# Patient Record
Sex: Female | Born: 1965 | Race: White | Hispanic: No | Marital: Married | Smoking: Never smoker
Health system: Southern US, Community
[De-identification: ages and names within clinical notes are randomized; demographics above are authoritative.]

## PROBLEM LIST (undated history)

## (undated) DIAGNOSIS — R609 Edema, unspecified: Secondary | ICD-10-CM

## (undated) DIAGNOSIS — Z872 Personal history of diseases of the skin and subcutaneous tissue: Secondary | ICD-10-CM

## (undated) DIAGNOSIS — R069 Unspecified abnormalities of breathing: Secondary | ICD-10-CM

## (undated) DIAGNOSIS — I10 Essential (primary) hypertension: Secondary | ICD-10-CM

## (undated) DIAGNOSIS — Z8709 Personal history of other diseases of the respiratory system: Secondary | ICD-10-CM

## (undated) DIAGNOSIS — L659 Nonscarring hair loss, unspecified: Secondary | ICD-10-CM

## (undated) DIAGNOSIS — R7303 Prediabetes: Secondary | ICD-10-CM

## (undated) DIAGNOSIS — R0789 Other chest pain: Secondary | ICD-10-CM

## (undated) DIAGNOSIS — T7840XA Allergy, unspecified, initial encounter: Secondary | ICD-10-CM

## (undated) DIAGNOSIS — K219 Gastro-esophageal reflux disease without esophagitis: Secondary | ICD-10-CM

## (undated) DIAGNOSIS — G5601 Carpal tunnel syndrome, right upper limb: Secondary | ICD-10-CM

## (undated) DIAGNOSIS — R0600 Dyspnea, unspecified: Secondary | ICD-10-CM

## (undated) DIAGNOSIS — R0982 Postnasal drip: Secondary | ICD-10-CM

## (undated) DIAGNOSIS — Z98811 Dental restoration status: Secondary | ICD-10-CM

## (undated) DIAGNOSIS — E669 Obesity, unspecified: Secondary | ICD-10-CM

## (undated) DIAGNOSIS — J45909 Unspecified asthma, uncomplicated: Secondary | ICD-10-CM

## (undated) DIAGNOSIS — E781 Pure hyperglyceridemia: Secondary | ICD-10-CM

## (undated) HISTORY — DX: Nonscarring hair loss, unspecified: L65.9

## (undated) HISTORY — PX: OVARIAN CYST REMOVAL: SHX89

## (undated) HISTORY — DX: Allergy, unspecified, initial encounter: T78.40XA

## (undated) HISTORY — PX: OTHER SURGICAL HISTORY: SHX169

## (undated) HISTORY — DX: Essential (primary) hypertension: I10

## (undated) HISTORY — DX: Personal history of diseases of the skin and subcutaneous tissue: Z87.2

## (undated) HISTORY — DX: Obesity, unspecified: E66.9

## (undated) HISTORY — PX: WISDOM TOOTH EXTRACTION: SHX21

## (undated) HISTORY — DX: Other chest pain: R07.89

## (undated) HISTORY — DX: Dyspnea, unspecified: R06.00

## (undated) HISTORY — PX: VAGINAL HYSTERECTOMY: SUR661

## (undated) HISTORY — DX: Unspecified abnormalities of breathing: R06.9

## (undated) HISTORY — DX: Pure hyperglyceridemia: E78.1

---

## 1998-09-25 ENCOUNTER — Other Ambulatory Visit: Admission: RE | Admit: 1998-09-25 | Discharge: 1998-09-25 | Payer: Self-pay | Admitting: Obstetrics and Gynecology

## 2001-08-17 HISTORY — PX: DILATION AND CURETTAGE OF UTERUS: SHX78

## 2001-10-17 ENCOUNTER — Other Ambulatory Visit: Admission: RE | Admit: 2001-10-17 | Discharge: 2001-10-17 | Payer: Self-pay | Admitting: Gynecology

## 2002-05-11 ENCOUNTER — Other Ambulatory Visit: Admission: RE | Admit: 2002-05-11 | Discharge: 2002-05-11 | Payer: Self-pay | Admitting: Gynecology

## 2002-08-07 ENCOUNTER — Encounter: Payer: Self-pay | Admitting: Gynecology

## 2002-08-07 ENCOUNTER — Ambulatory Visit (HOSPITAL_COMMUNITY): Admission: RE | Admit: 2002-08-07 | Discharge: 2002-08-07 | Payer: Self-pay | Admitting: Gynecology

## 2003-06-25 ENCOUNTER — Other Ambulatory Visit: Admission: RE | Admit: 2003-06-25 | Discharge: 2003-06-25 | Payer: Self-pay | Admitting: Obstetrics and Gynecology

## 2003-11-29 ENCOUNTER — Inpatient Hospital Stay (HOSPITAL_COMMUNITY): Admission: AD | Admit: 2003-11-29 | Discharge: 2003-12-01 | Payer: Self-pay | Admitting: Physical Therapy

## 2003-12-17 ENCOUNTER — Inpatient Hospital Stay (HOSPITAL_COMMUNITY): Admission: AD | Admit: 2003-12-17 | Discharge: 2003-12-19 | Payer: Self-pay | Admitting: Obstetrics and Gynecology

## 2003-12-21 ENCOUNTER — Inpatient Hospital Stay (HOSPITAL_COMMUNITY): Admission: AD | Admit: 2003-12-21 | Discharge: 2003-12-24 | Payer: Self-pay | Admitting: Obstetrics and Gynecology

## 2003-12-25 ENCOUNTER — Encounter: Admission: RE | Admit: 2003-12-25 | Discharge: 2004-01-24 | Payer: Self-pay | Admitting: Obstetrics and Gynecology

## 2004-01-30 ENCOUNTER — Other Ambulatory Visit: Admission: RE | Admit: 2004-01-30 | Discharge: 2004-01-30 | Payer: Self-pay | Admitting: Obstetrics and Gynecology

## 2004-01-31 ENCOUNTER — Other Ambulatory Visit: Admission: RE | Admit: 2004-01-31 | Discharge: 2004-01-31 | Payer: Self-pay | Admitting: Obstetrics and Gynecology

## 2005-01-01 ENCOUNTER — Encounter: Admission: RE | Admit: 2005-01-01 | Discharge: 2005-01-01 | Payer: Self-pay | Admitting: Surgery

## 2007-10-25 ENCOUNTER — Ambulatory Visit (HOSPITAL_COMMUNITY): Admission: RE | Admit: 2007-10-25 | Discharge: 2007-10-26 | Payer: Self-pay | Admitting: Obstetrics and Gynecology

## 2007-10-25 ENCOUNTER — Encounter (INDEPENDENT_AMBULATORY_CARE_PROVIDER_SITE_OTHER): Payer: Self-pay | Admitting: Obstetrics and Gynecology

## 2007-10-25 HISTORY — PX: LAPAROSCOPIC ASSISTED VAGINAL HYSTERECTOMY: SHX5398

## 2008-08-17 HISTORY — PX: SALPINGOOPHORECTOMY: SHX82

## 2009-07-30 ENCOUNTER — Encounter: Admission: RE | Admit: 2009-07-30 | Discharge: 2009-07-30 | Payer: Self-pay | Admitting: Obstetrics and Gynecology

## 2010-12-30 NOTE — Discharge Summary (Signed)
NAMEPIERINA, Nicole Cooper              ACCOUNT NO.:  [id number]   MEDICAL RECORD NO.:  [id number]          PATIENT TYPE:  OIB   LOCATION:  9311                          FACILITY:  [facility]   PHYSICIAN:  Dineen Kid. Rana Snare, M.D.    DATE OF BIRTH:  Apr 01, 1966   DATE OF ADMISSION:  10/25/2007  DATE OF DISCHARGE:  10/26/2007                               DISCHARGE SUMMARY   HISTORY OF PRESENT ILLNESS:  Ms. Cubero is a 45 year old gravida 1,  para 1, with no further child bearing desires with recurrent endometrial  polyps, menorrhagia. She also has recurrent cervical dysplasia.  At this  time, wants definitive surgical intervention and requests hysterectomy.  She presents for laparoscopic-assisted vaginal hysterectomy.   HOSPITAL COURSE:  The patient underwent a laparoscopic-assisted vaginal  hysterectomy.  The surgery was uncomplicated.  At the time of  surgery,  she had normal appearing uterus, tubes, ovaries,cul-de-sac, appendix,  and liver.  The blood loss during surgery was 200 cc. Her postoperative  care was unremarkable. She had good return of bowel function and by  postoperative day number 1 was ambulating without difficulty, tolerating  a regular diet.  Incisions were clean, dry, and intact. She had normal  active bowel sounds, able to pass flatus, able to tolerate p.o. pain  medication and was discharged home.  She remained afebrile throughout  her hospital course.  Her postoperative hemoglobin was 10.6.   DISPOSITION:  The patient will be discharged home to followup at the  office in 2 to 3 weeks, sent home with a routine instruction sheet for  hysterectomy and told to return for increased pain, fever, or bleeding.  She was also sent home with a prescription for Oxycodone, #30.      Dineen Kid Rana Snare, M.D.  Electronically Signed     DCL/MEDQ  D:  10/26/2007  T:  10/26/2007  Job:  [id number]

## 2010-12-30 NOTE — H&P (Signed)
Nicole Cooper, Nicole Cooper              ACCOUNT NO.:  [id number]   MEDICAL RECORD NO.:  [id number]          PATIENT TYPE:  AMB   LOCATION:  SDC                           [facility]   PHYSICIAN:  Dineen Kid. Rana Snare, M.D.    DATE OF BIRTH:  02-18-1966   DATE OF ADMISSION:  10/25/2007  DATE OF DISCHARGE:                              HISTORY & PHYSICAL   HISTORY OF PRESENT ILLNESS:  Ms. Julius is a 45 year old, G1, P1, with  menorrhagia, history of recurrent endometrial polyps and recurrent  cervical dysplasia.  No further childbearing desires. She is presenting  for laparoscopic-assisted vaginal hysterectomy. Her history is  significant for a history of polyps, which recurred several times,  removed by D&C by Dr. Marny Lowenstein.  Also a history of infertility. Currently  she is on birth control pills for control of her menorrhagia with some  control but does not want to stay on birth control pills due to a family  history of breast cancer.  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  She desires hysterectomy  rather than following or ongoing treatment of the cervical dysplasia and  presents today for hysterectomy.   PAST SURGICAL HISTORY:  Significant for:  1. D & Cs.  2. Removal of dermoid tumor  3. Cesarean section   MEDICATIONS:  Birth control pills.   ALLERGIES:  She has an allergy to SULFA.   PHYSICAL EXAMINATION:  VITAL SIGNS:  Blood pressure 124/82.  Her weight  is 251 pounds.  HEART:  Regular rate and rhythm.  LUNGS:  Clear to auscultation bilaterally.  ABDOMEN:  Nondistended, nontender.  PELVIC:  Normal external genitalia, Bartholin, Skene, urethra. Uterus  anteverted, mobile, nontender.  No adnexal masses are palpable.   IMPRESSION AND PLAN:  Menorrhagia and history of recurrent endometrial  polyps under some control with birth control pills, but she desires not  to  continue birth control pills due to family history of breast cancer;  also recurrent cervical dysplasia.  She desires laparoscopic-assisted  vaginal hysterectomy with preservation of the ovaries.  I discussed the  procedure at length, its recovery, its risks and its benefits which  include but not limited to risk of infection, bleeding, damage to the  bowel, bladder, ureters, ovaries.  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 She does give her informed consent and wishes to proceed.     Dineen Kid Rana Snare, M.D.  Electronically Signed    DCL/MEDQ  D:  10/24/2007  T:  10/24/2007  Job:  [id number]

## 2010-12-30 NOTE — Op Note (Signed)
NAMEASHLINN, HEMRICK              ACCOUNT NO.:  [id number]   MEDICAL RECORD NO.:  [id number]          PATIENT TYPE:  AMB   LOCATION:  SDC                           [facility]   PHYSICIAN:  Dineen Kid. Rana Snare, M.D.    DATE OF BIRTH:  1965/11/21   DATE OF PROCEDURE:  10/25/2007  DATE OF DISCHARGE:                               OPERATIVE REPORT   PREOPERATIVE DIAGNOSIS:  Menorrhagia, recurrent endometrial polyps,  recurrent cervical dysplasia.   POSTOPERATIVE DIAGNOSIS:  Menorrhagia, recurrent endometrial polyps,  recurrent cervical dysplasia.   PROCEDURE:  Laparoscopic assisted vaginal hysterectomy.   SURGEON:  Dineen Kid. Rana Snare, M.D.   ANESTHESIA:  General.   ASSISTANT:  Guy Sandifer. Henderson Cloud, M.D.   INDICATIONS:  Ms. Freyre is a 45 year old G1, P1 with no further  childbearing desires who had recurrent endometrial polyps which have  been benign and worsening menorrhagia.  She also has recurrent cervical  dysplasia.  She desires definitive surgical intervention and requests  hysterectomy.  She presents for that today.  See history and physical  for further details.   OPERATIVE FINDINGS:  At the time of surgery, normal appearing uterus,  tubes, ovaries, cul-de-sac, appendix and liver.   DESCRIPTION OF PROCEDURE:  After adequate analgesia, the patient was  placed in the dorsal lithotomy position.  She was sterilely prepped and  draped.  The bladder was sterilely drained.  A Graves speculum was  placed.  A tenaculum was placed on the anterior lip of the cervix, a  tenaculum was placed.  A 1 cm infraumbilical skin incision was made.  A  Veress needle was inserted.  The abdomen was then insufflated to  dullness of percussion.  An 11 mm trocar was inserted, the laparoscope  was inserted, and the above findings were noted.  A 5 mm trocar was  inserted to the left of midline 2 fingerbreadths above the pubic  symphysis under direct visualization.  The left utero-ovarian ligament  was  identified, ligated with a gyrus ligator, and dissection was also  carried out across the left fallopian tube and round ligament with gyrus  cutting forceps with good hemostasis achieved.  The right broad ligament  was identified. The round ligament, utero-ovarian ligament and fallopian  tube were ligated and dissected down to the inferior portion of the  round ligament.  The bladder flap was then created elevating the bladder  and making the incision at the uterovesical junction.  The abdomen was  then desufflated, legs repositioned, weighted speculum placed in the  posterior vagina.  A posterior colpotomy was performed.  The cervix was  circumscribed with Bovie cautery.  A LigaSure instrument was used to  ligate across the uterosacral ligaments bilaterally, the cardinal  ligaments bilaterally, and the bladder pillars bilaterally dissected  with Mayo scissors.  The anterior vaginal mucosa was dissected off the  anterior portion of the cervix and anterior peritoneum was entered  sharply.  A Deaver retractor was placed underneath the bladder.  The  inferior portions of the broad ligaments were ligated with LigaSure  instrument and cut with the Mayo scissors.  The  uterus was then removed  intact.  The uterosacral ligaments were identified.  A figure-of-eight 0  Monocryl suture was used to ligate each.  The posterior peritoneum was  then closed in a pursestring fashion using 0 Monocryl suture.  The  vaginal mucosa was then plicated vertically using figure-of-eights of 0  Monocryl suture with good hemostasis achieved and good apical support  noted.  A  Foley catheter was placed returning clear yellow urine.  The  legs were repositioned and the abdomen reinsufflated.  After a copious  amount of irrigation, the pedicles were all checked, the ureters  appeared to be normal with good peristalsis noted bilaterally.  After a  copious amount of irrigation and adequate hemostasis was assured, the   trocar was removed.  The infraumbilical skin incision was closed with a  3-0 Vicryl Rapide subcuticular suture.  The 5 mm site was closed with a  3-0 Vicryl Rapide subcuticular suture.  The incisions were injected with  0.25% Marcaine, a total of 10 mL used.  The patient was stable on  transfer to the recovery room.  Sponge, instrument, and needle counts  were correct x3.  Estimated blood loss was 200 mL.  The patient received  1 gram of cefotetan preoperatively.      Dineen Kid Rana Snare, M.D.  Electronically Signed     DCL/MEDQ  D:  10/25/2007  T:  10/25/2007  Job:  [id number]

## 2011-01-02 NOTE — Discharge Summary (Signed)
NAMEISSIS, LINDSETH                          ACCOUNT NO.:  [id number]   MEDICAL RECORD NO.:  [id number]                   PATIENT TYPE:  INP   LOCATION:  9113                                 [facility]ACILITY[facility]   PHYSICIAN:  Guy Sandifer. Henderson Cloud, M.D.              DATE OF BIRTH:  1966-04-11   DATE OF ADMISSION:  12/21/2003  DATE OF DISCHARGE:  12/24/2003                                 DISCHARGE SUMMARY   ADMITTING DIAGNOSES:  1. Intrauterine pregnancy at 17 and 3/7ths weeks.  2. Spontaneous onset of labor.   DISCHARGE DIAGNOSES:  1. Status post low transverse cesarean section secondary to non-reassuring     fetal heart tones and failure to progress.  2. Viable female infant.   PROCEDURE:  Primary low transverse cesarean section.   REASON FOR ADMISSION:  Please see written H&P.   HOSPITAL COURSE:  The patient was a 45 year old white married female,  primigravida, who presented to [facility] at 35-1/2 weeks  with spontaneous onset of labor.  The pregnancy had been complicated by  chronic hypertension, as well as advanced maternal age.  Amniocentesis had  been performed earlier this week, which revealed mature pulmonary status.  The patient did progress to complete dilatation.  After 2 hours of pushing,  fetal vertex remained arrested at a zero station with minimal descent.  Fetal heart tone had revealed deepening variable decelerations, some with a  late return during pushing.  Due to lack of progress and worsening fetal  heart rate, the decision was made to proceed with a primary low transverse  cesarean section.  The patient was then taken to the operating room where an  epidural and finally a spinal anesthesia was obtained without difficulty.  A  low transverse incision was made, with the delivery of a viable female infant  weighing 8 pounds, 3 ounces, Apgars of 8 at 1 minute, 9 at 5 minutes.  Umbilical cord pH was 7.24.  The patient tolerated the procedure well, and  was taken to the recovery room in stable condition.  On postoperative day  #1, vital signs were stable, she was afebrile.  Abdominal dressing was noted  to be clean, dry, and intact.  Fundus was firm and nontender.  Abdomen was  soft with good return of bowel function.  Labs revealed a hemoglobin of  11.0.  On postoperative day #2, vital signs were stable, she was afebrile,  abdomen was soft, fundus was firm and nontender, abdominal dressing had been  removed revealing an incision that was clean, dry, and intact.  The patient  was ambulating well and tolerating a regular diet without complaints of  nausea or vomiting.  On postoperative day #3, the patient was without  complaint, vital signs were stable, she was afebrile, abdomen was soft,  fundus was firm and nontender, incision was clean, dry, and intact, staples  were removed.  Discharge instructions were reviewed with  the patient, and  she was discharged home.   CONDITION ON DISCHARGE:  Good.   DIET:  Regular as tolerated.   ACTIVITY:  No heavy lifting.  No driving x2 weeks.  No vaginal entry.   FOLLOW UP:  The patient is to follow up in the office in 1 week for an  incision check.  She is to call for a temperature of greater than 100  degrees, persistent nausea or vomiting, heavy vaginal bleeding, and/or  redness or drainage from the incisional site.   DISCHARGE MEDICATIONS:  1. Tylox, #30, one q.4-6h. p.r.n.  2. Motrin 600 mg q.6h.  3. Prenatal vitamins one p.o. daily.  4. Colace one p.o. daily p.r.n.     Julio Sicks, N.P.                        Guy Sandifer. Henderson Cloud, M.D.    CC/MEDQ  D:  01/30/2004  T:  01/31/2004  Job:  [id number]

## 2011-01-02 NOTE — Discharge Summary (Signed)
Nicole Cooper, Nicole Cooper                          ACCOUNT NO.:  [id number]   MEDICAL RECORD NO.:  [id number]                   PATIENT TYPE:  INP   LOCATION:  9158                                 [facility]ILITY[facility]   PHYSICIAN:  Tracie Harrier, M.D.              DATE OF BIRTH:  11-16-1965   DATE OF ADMISSION:  11/29/2003  DATE OF DISCHARGE:  12/01/2003                                 DISCHARGE SUMMARY   ADMITTING DIAGNOSES:  1. Intrauterine pregnancy at 62 and two-sevenths estimated gestational age.  2. Preterm labor.  3. Chronic hypertension.   DISCHARGE DIAGNOSES:  1. Intrauterine pregnancy at 32 weeks estimated gestational age.  2. Preterm labor, tocolyzed.   REASON FOR ADMISSION:  Please see written H&P.   HOSPITAL COURSE:  The patient was a 45 year old white married female  primigravida that was admitted to [facility] at 32 and two-  sevenths weeks with complaints of uterine contractions for approximately 2  weeks.  She denied rupture of membranes, vaginal bleeding, headache, or  blurred vision.  She had been seen in the office where her cervix was noted  to be dilated 2 cm, 50% effaced, vertex at a -3 station.  Prenatal care had  been complicated by chronic hypertension; however, the patient had not  required medication during her pregnancy.  She had a history of asthma and  glucose screening was failed with a 3-hour GTT resulting in a fasting blood  sugar of 103 and a 1-hour of 179.  On admission to [facility] fetal heart tones were reactive, uterine contractions were noted to  be every 5-10 minutes.  The patient was admitted for tocolysis.  On  admission magnesium sulfate was administered for tocolysis.  She was given  IV antibiotics prophylactically and betamethasone was administered to  enhance fetal lung maturity.  PIH labs were also drawn to rule out pregnancy-  induced hypertension.  On hospital day #1 magnesium sulfate was running at  2  g/hour.  Vital signs were stable, blood pressure 106/53, fetal heart tones  were reactive.  Magnesium sulfate was continued for 24 hours.  On hospital  day #2 contraction pattern had resolved.  Vital signs were stable, fetal  heart tones were in the 140s.  Cervix was not reexamined.  Fasting glucose  was 134.  PIH labs had been within normal limits.  Magnesium sulfate was  discontinued and the patient was changed to oral terbutaline.  Later that  evening the patient continued without contractions and she was discharged  home.   CONDITION ON DISCHARGE:  Stable.   DIET:  Regular as tolerated..   ACTIVITY:  Modified bedrest, no vaginal entry.   FOLLOW-UP:  The patient is to follow up in the office in 1 week.  She was  instructed to call if contractions are greater than 5 per hour, spontaneous  rupture of membranes, vaginal bleeding, or decreased fetal  movement.   DISCHARGE MEDICATIONS:  1. Terbutaline 2.5 mg one p.o. q.4h.  2. Prenatal vitamins one p.o. daily.     Julio Sicks, N.P.                        Tracie Harrier, M.D.    CC/MEDQ  D:  12/11/2003  T:  12/11/2003  Job:  [id number]

## 2011-01-02 NOTE — Discharge Summary (Signed)
Nicole Cooper, Nicole Cooper              ACCOUNT NO.:  [id number]   MEDICAL RECORD NO.:  [id number]          PATIENT TYPE:  OIB   LOCATION:  9311                          [facility]   PHYSICIAN:  Dineen Kid. Rana Snare, M.D.    DATE OF BIRTH:  1966-06-06   DATE OF ADMISSION:  10/25/2007  DATE OF DISCHARGE:  10/26/2007                               DISCHARGE SUMMARY   HISTORY OF PRESENT ILLNESS:  Ms. Aaberg is a 45 year old G1, P1, with  menorrhagia, history of  recurrent endometrial polyps with recurrent  cervical dysplasia with no further childbearing desires.  She presents  for laparoscopically assisted vaginal hysterectomy.  She has been on  birth control pills to control her menorrhagia.  She does not want to  stay on the birth control pills due to a family history of breast  cancer.  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  She does desires hysterectomy  rather than following her ongoing treatment of cervical dysplasia with  the menorrhagia.   HOSPITAL COURSE:  The patient underwent laparoscopically assisted  vaginal hysterectomy.  Estimated blood loss 200 mL.  The procedure was  uncomplicated.  Her postoperative care was uncomplicated with good  return of bowel function and ambulation.  By postoperative day #1, she  was tolerating a regular diet, ambulating without difficulty.  Incisions  were clean, dry, and intact.  Her postoperative hemoglobin is 10.6, and  the patient was discharged to home.   DISPOSITION:  The patient discharged to home and will follow up in the  office in 2-3 weeks and will have the routine instruction sheet for  hysterectomy, told to return for increase pain, fevers, and bleeding.  She was sent out with a prescription for Percocet #30.   DISCHARGE CONDITION:  Good.      Dineen Kid Rana Snare, M.D.  Electronically Signed     DCL/MEDQ   D:  11/15/2007  T:  11/16/2007  Job:  [id number]

## 2011-01-02 NOTE — Op Note (Signed)
Nicole Cooper, MONDS                          ACCOUNT NO.:  [id number]   MEDICAL RECORD NO.:  [id number]                   PATIENT TYPE:  INP   LOCATION:  9113                                 [facility]ACILITY[facility]   PHYSICIAN:  Juluis Mire, M.D.                DATE OF BIRTH:  December 07, 1965   DATE OF PROCEDURE:  12/21/2003  DATE OF DISCHARGE:                                 OPERATIVE REPORT   PREOPERATIVE DIAGNOSES:  Pregnancy at 35+ weeks, with failure to descend.  Fetal heart rate demonstrating worsening variable decelerations with  pushing.   POSTOPERATIVE DIAGNOSES:  Pregnancy at 35+ weeks, with failure to descend.  Fetal heart rate demonstrating worsening variable decelerations with  pushing.   PROCEDURE:  Low transverse cesarean section.   SURGEON:  Juluis Mire, M.D.   ANESTHESIA:  Epidural/spinal.   ESTIMATED BLOOD LOSS:  400-600 cc.  No intraoperative blood replacement.   PACKS AND DRAINS:  None.   COMPLICATIONS:  None.   INDICATIONS:  A 45 year old primigravida married female, presents at 35-1/2  weeks with spontaneous onset of labor.  The pregnancy was complicated by  chronic hypertension, as well as advanced maternal age.  Amniocentesis  earlier this week had revealed mature pulmonary status.  The patient  progressed to complete dilatation after two plus hours of pushing; fetal  vertex remained arrested at 0 station with minimal descent.  Fetal heart  rate revealed deepening variable decelerations, some with late return during  pushing.  Due to the lack of progress and the worsened fetal heart rate,  decision was to proceed with primary cesarean section.   The risks were discussed, including the risks of infection.  The risks of  hemorrhage, that could necessitate transfusion with the risks of AIDS or  hepatitis.  The risks of injury to adjacent organs; including bladder, bowel  or ureters that could require further exploratory surgery.  Risks of deep  venous  thrombosis and pulmonary embolus.   DESCRIPTION OF PROCEDURE:  The patient was taken to the OR and placed in  supine position with a left lateral tilt.  After satisfactory level of  epidural and finally spinal anesthesia was obtained, the abdomen was prepped  out with Betadine and draped in a sterile field.  A low transverse skin  incision was identified and excised. The excision was extended through  subcutaneous tissue.  The fascia was entered sharply and the incision of the  fascia extended laterally.  The fascia was taken off the muscles superiorly  and inferiorly.  Rectus muscles were separated at midline.  The anterior  peritoneum was entered sharply and the incision of the peritoneum extended  both superiorly and inferiorly.  Omental adhesions were noted and were taken  down using Kelly clamps and free ties.   At this point and time, a low transverse bladder flap was developed.  A low  transverse uterine incision was begun  with the knife and extended laterally  using Mayo traction.   The infant presented in the vertex presentation.  It was directly OP and was  delivered at the elevation of the head and fundal pressure.  The infant was  a viable female, weighing 8 pounds 3 ounces; Apgars were 8 and 9 and cord pH  was 7.24.   The placenta was then delivered manually.  The uterus was then closed around  with sutures of 0 chromic, using a two-layered closure technique.  Tubes and  ovaries were unremarkable.  Hemostasis was excellent, and urine output was  clear and adequate.  Muscles were reapproximated with running suture of 0  Vicryl. The fascia was closed with running suture of 0 PDS.  The  subcutaneous was closed with a running suture of 0 plain catgut.  The skin  was closed with staples.  The sponge, needle and instrument counts were  reported correct by circulating nurse x2.  Foley catheter remained clear at  the time of the closure.  The patient tolerated the procedure well and  was  returned to the recovery room in good condition.                                               Juluis Mire, M.D.    JSM/MEDQ  D:  12/21/2003  T:  12/23/2003  Job:  [id number]

## 2011-01-14 ENCOUNTER — Other Ambulatory Visit: Payer: Self-pay | Admitting: Obstetrics and Gynecology

## 2011-01-14 DIAGNOSIS — Z1231 Encounter for screening mammogram for malignant neoplasm of breast: Secondary | ICD-10-CM

## 2011-02-17 ENCOUNTER — Ambulatory Visit
Admission: RE | Admit: 2011-02-17 | Discharge: 2011-02-17 | Disposition: A | Discharge status: Home / Self Care | Payer: BC Managed Care – PPO | Source: Ambulatory Visit | Attending: Obstetrics and Gynecology | Admitting: Obstetrics and Gynecology

## 2011-02-17 DIAGNOSIS — Z1231 Encounter for screening mammogram for malignant neoplasm of breast: Secondary | ICD-10-CM

## 2011-05-11 LAB — CBC
HCT: 30.2 — ABNORMAL LOW
HCT: 41.5
Hemoglobin: 10.6 — ABNORMAL LOW
MCV: 84.6
Platelets: 224
Platelets: 304
RBC: 4.88
WBC: 10.2

## 2011-05-11 LAB — COMPREHENSIVE METABOLIC PANEL
Alkaline Phosphatase: 79
CO2: 29
Calcium: 9.5
Chloride: 98
Creatinine, Ser: 0.71
GFR calc Af Amer: >60
GFR calc non Af Amer: >60
Glucose, Bld: 101 — ABNORMAL HIGH

## 2012-10-19 ENCOUNTER — Other Ambulatory Visit: Payer: Self-pay | Admitting: Orthopedic Surgery

## 2012-11-17 ENCOUNTER — Encounter (HOSPITAL_BASED_OUTPATIENT_CLINIC_OR_DEPARTMENT_OTHER): Payer: Self-pay | Admitting: *Deleted

## 2012-11-17 NOTE — Pre-Procedure Instructions (Signed)
To come for BMET 

## 2012-11-18 MED ORDER — OXYCODONE HCL 5 MG PO TABS
ORAL_TABLET | ORAL | Status: AC
  Filled 2012-11-18: qty 1

## 2012-11-21 ENCOUNTER — Encounter (HOSPITAL_BASED_OUTPATIENT_CLINIC_OR_DEPARTMENT_OTHER)
Admission: RE | Admit: 2012-11-21 | Discharge: 2012-11-21 | Disposition: A | Discharge status: Home / Self Care | Payer: BC Managed Care – PPO | Source: Ambulatory Visit | Attending: Orthopedic Surgery | Admitting: Orthopedic Surgery

## 2012-11-21 LAB — BASIC METABOLIC PANEL
CO2: 28 mEq/L (ref 19–32)
Calcium: 9.7 mg/dL (ref 8.4–10.5)
Chloride: 100 mEq/L (ref 96–112)
GFR calc non Af Amer: 69 mL/min — ABNORMAL LOW (ref >90)
Glucose, Bld: 106 mg/dL — ABNORMAL HIGH (ref 70–99)
Potassium: 3.6 mEq/L (ref 3.5–5.1)
Sodium: 139 mEq/L (ref 135–145)

## 2012-11-23 ENCOUNTER — Ambulatory Visit (HOSPITAL_BASED_OUTPATIENT_CLINIC_OR_DEPARTMENT_OTHER): Payer: BC Managed Care – PPO | Admitting: Anesthesiology

## 2012-11-23 ENCOUNTER — Encounter (HOSPITAL_BASED_OUTPATIENT_CLINIC_OR_DEPARTMENT_OTHER)
Admission: RE | Disposition: A | Discharge status: Home / Self Care | Payer: Self-pay | Source: Ambulatory Visit | Attending: Orthopedic Surgery

## 2012-11-23 ENCOUNTER — Ambulatory Visit (HOSPITAL_BASED_OUTPATIENT_CLINIC_OR_DEPARTMENT_OTHER)
Admission: RE | Admit: 2012-11-23 | Discharge: 2012-11-23 | Disposition: A | Discharge status: Home / Self Care | Payer: BC Managed Care – PPO | Source: Ambulatory Visit | Attending: Orthopedic Surgery | Admitting: Orthopedic Surgery

## 2012-11-23 ENCOUNTER — Encounter (HOSPITAL_BASED_OUTPATIENT_CLINIC_OR_DEPARTMENT_OTHER): Payer: Self-pay | Admitting: Anesthesiology

## 2012-11-23 DIAGNOSIS — Z6841 Body Mass Index (BMI) 40.0 and over, adult: Secondary | ICD-10-CM | POA: Insufficient documentation

## 2012-11-23 DIAGNOSIS — R0982 Postnasal drip: Secondary | ICD-10-CM | POA: Insufficient documentation

## 2012-11-23 DIAGNOSIS — G5600 Carpal tunnel syndrome, unspecified upper limb: Secondary | ICD-10-CM | POA: Insufficient documentation

## 2012-11-23 DIAGNOSIS — Z882 Allergy status to sulfonamides status: Secondary | ICD-10-CM | POA: Insufficient documentation

## 2012-11-23 DIAGNOSIS — Z79899 Other long term (current) drug therapy: Secondary | ICD-10-CM | POA: Insufficient documentation

## 2012-11-23 HISTORY — DX: Postnasal drip: R09.82

## 2012-11-23 HISTORY — DX: Dental restoration status: Z98.811

## 2012-11-23 HISTORY — DX: Personal history of other diseases of the respiratory system: Z87.09

## 2012-11-23 HISTORY — PX: CARPAL TUNNEL RELEASE: SHX101

## 2012-11-23 SURGERY — CARPAL TUNNEL RELEASE
Anesthesia: Regional | Site: Wrist | Laterality: Left | Wound class: Clean

## 2012-11-23 MED ORDER — MIDAZOLAM HCL 2 MG/2ML IJ SOLN: 1.0000 mg | INTRAMUSCULAR | Status: DC | PRN

## 2012-11-23 MED ORDER — PROPOFOL 10 MG/ML IV EMUL
INTRAVENOUS | Status: DC | PRN
  Administered 2012-11-23: 100 ug/kg/min via INTRAVENOUS

## 2012-11-23 MED ORDER — CEFAZOLIN SODIUM-DEXTROSE 2-3 GM-% IV SOLR
2.0000 g | INTRAVENOUS | Status: AC
  Administered 2012-11-23: 2 g via INTRAVENOUS

## 2012-11-23 MED ORDER — PROMETHAZINE HCL 25 MG/ML IJ SOLN: 6.2500 mg | INTRAMUSCULAR | Status: DC | PRN

## 2012-11-23 MED ORDER — HYDROCODONE-ACETAMINOPHEN 5-325 MG PO TABS: 1.0000 | ORAL_TABLET | Freq: Four times a day (QID) | ORAL | Status: DC | PRN

## 2012-11-23 MED ORDER — LACTATED RINGERS IV SOLN
INTRAVENOUS | Status: DC
  Administered 2012-11-23: 08:00:00 via INTRAVENOUS

## 2012-11-23 MED ORDER — CHLORHEXIDINE GLUCONATE 4 % EX LIQD: 60.0000 mL | Freq: Once | CUTANEOUS | Status: DC

## 2012-11-23 MED ORDER — ONDANSETRON HCL 4 MG/2ML IJ SOLN
INTRAMUSCULAR | Status: DC | PRN
  Administered 2012-11-23: 4 mg via INTRAVENOUS

## 2012-11-23 MED ORDER — MIDAZOLAM HCL 5 MG/5ML IJ SOLN
INTRAMUSCULAR | Status: DC | PRN
  Administered 2012-11-23: 2 mg via INTRAVENOUS

## 2012-11-23 MED ORDER — OXYCODONE HCL 5 MG PO TABS
5.0000 mg | ORAL_TABLET | Freq: Once | ORAL | Status: AC | PRN
  Administered 2012-11-23: 5 mg via ORAL

## 2012-11-23 MED ORDER — CEFAZOLIN SODIUM-DEXTROSE 2-3 GM-% IV SOLR: 2.0000 g | INTRAVENOUS | Status: DC

## 2012-11-23 MED ORDER — FENTANYL CITRATE 0.05 MG/ML IJ SOLN: 50.0000 ug | INTRAMUSCULAR | Status: DC | PRN

## 2012-11-23 MED ORDER — BUPIVACAINE HCL (PF) 0.25 % IJ SOLN
INTRAMUSCULAR | Status: DC | PRN
  Administered 2012-11-23: 6 mL

## 2012-11-23 MED ORDER — FENTANYL CITRATE 0.05 MG/ML IJ SOLN
INTRAMUSCULAR | Status: DC | PRN
  Administered 2012-11-23: 100 ug via INTRAVENOUS

## 2012-11-23 MED ORDER — MIDAZOLAM HCL 2 MG/ML PO SYRP: 12.0000 mg | ORAL_SOLUTION | Freq: Once | ORAL | Status: DC | PRN

## 2012-11-23 MED ORDER — LIDOCAINE HCL (PF) 0.5 % IJ SOLN
INTRAMUSCULAR | Status: DC | PRN
  Administered 2012-11-23: 30 mL via INTRAVENOUS

## 2012-11-23 MED ORDER — MEPERIDINE HCL 25 MG/ML IJ SOLN: 6.2500 mg | INTRAMUSCULAR | Status: DC | PRN

## 2012-11-23 MED ORDER — OXYCODONE HCL 5 MG/5ML PO SOLN: 5.0000 mg | Freq: Once | ORAL | Status: AC | PRN

## 2012-11-23 MED ORDER — PROPOFOL 10 MG/ML IV BOLUS
INTRAVENOUS | Status: DC | PRN
  Administered 2012-11-23: 20 mg via INTRAVENOUS

## 2012-11-23 MED ORDER — MIDAZOLAM HCL 2 MG/2ML IJ SOLN: 0.5000 mg | Freq: Once | INTRAMUSCULAR | Status: DC | PRN

## 2012-11-23 MED ORDER — FENTANYL CITRATE 0.05 MG/ML IJ SOLN
25.0000 ug | INTRAMUSCULAR | Status: DC | PRN
  Administered 2012-11-23: 50 ug via INTRAVENOUS

## 2012-11-23 SURGICAL SUPPLY — 37 items
BANDAGE GAUZE ELAST BULKY 4 IN (GAUZE/BANDAGES/DRESSINGS) ×2 IMPLANT
BLADE SURG 15 STRL LF DISP TIS (BLADE) ×1 IMPLANT
BLADE SURG 15 STRL SS (BLADE) ×2
BNDG CMPR 9X4 STRL LF SNTH (GAUZE/BANDAGES/DRESSINGS) ×1
BNDG COHESIVE 3X5 TAN STRL LF (GAUZE/BANDAGES/DRESSINGS) ×2 IMPLANT
BNDG ESMARK 4X9 LF (GAUZE/BANDAGES/DRESSINGS) ×1 IMPLANT
CHLORAPREP W/TINT 26ML (MISCELLANEOUS) ×2 IMPLANT
CLOTH BEACON ORANGE TIMEOUT ST (SAFETY) ×1 IMPLANT
CORDS BIPOLAR (ELECTRODE) ×2 IMPLANT
COVER MAYO STAND STRL (DRAPES) ×2 IMPLANT
COVER TABLE BACK 60X90 (DRAPES) ×2 IMPLANT
CUFF TOURNIQUET SINGLE 18IN (TOURNIQUET CUFF) ×2 IMPLANT
DRAPE EXTREMITY T 121X128X90 (DRAPE) ×2 IMPLANT
DRAPE SURG 17X23 STRL (DRAPES) ×2 IMPLANT
DRSG KUZMA FLUFF (GAUZE/BANDAGES/DRESSINGS) ×2 IMPLANT
GAUZE XEROFORM 1X8 LF (GAUZE/BANDAGES/DRESSINGS) ×2 IMPLANT
GLOVE BIO SURGEON STRL SZ 6.5 (GLOVE) ×3 IMPLANT
GLOVE BIOGEL PI IND STRL 8.5 (GLOVE) ×1 IMPLANT
GLOVE BIOGEL PI INDICATOR 8.5 (GLOVE) ×1
GLOVE SURG ORTHO 8.0 STRL STRW (GLOVE) ×2 IMPLANT
GOWN BRE IMP PREV XXLGXLNG (GOWN DISPOSABLE) ×2 IMPLANT
GOWN PREVENTION PLUS XLARGE (GOWN DISPOSABLE) ×3 IMPLANT
NEEDLE 27GAX1X1/2 (NEEDLE) ×1 IMPLANT
NS IRRIG 1000ML POUR BTL (IV SOLUTION) ×2 IMPLANT
PACK BASIN DAY SURGERY FS (CUSTOM PROCEDURE TRAY) ×2 IMPLANT
PAD CAST 3X4 CTTN HI CHSV (CAST SUPPLIES) ×1 IMPLANT
PADDING CAST ABS 4INX4YD NS (CAST SUPPLIES) ×1
PADDING CAST ABS COTTON 4X4 ST (CAST SUPPLIES) ×1 IMPLANT
PADDING CAST COTTON 3X4 STRL (CAST SUPPLIES) ×2
SPONGE GAUZE 4X4 12PLY (GAUZE/BANDAGES/DRESSINGS) ×2 IMPLANT
STOCKINETTE 4X48 STRL (DRAPES) ×2 IMPLANT
SUT VICRYL 4-0 PS2 18IN ABS (SUTURE) IMPLANT
SUT VICRYL RAPIDE 4/0 PS 2 (SUTURE) ×2 IMPLANT
SYR BULB 3OZ (MISCELLANEOUS) ×2 IMPLANT
SYR CONTROL 10ML LL (SYRINGE) ×1 IMPLANT
TOWEL OR 17X24 6PK STRL BLUE (TOWEL DISPOSABLE) ×2 IMPLANT
UNDERPAD 30X30 INCONTINENT (UNDERPADS AND DIAPERS) ×2 IMPLANT

## 2012-11-23 NOTE — Transfer of Care (Signed)
Immediate Anesthesia Transfer of Care Note  Patient: Nicole Cooper  Procedure(s) Performed: Procedure(s): LEFT CARPAL TUNNEL RELEASE (Left)  Patient Location: PACU  Anesthesia Type:Bier block  Level of Consciousness: awake, alert  and oriented  Airway & Oxygen Therapy: Patient Spontanous Breathing and Patient connected to face mask oxygen  Post-op Assessment: Report given to PACU RN and Post -op Vital signs reviewed and stable  Post vital signs: Reviewed and stable  Complications: No apparent anesthesia complications

## 2012-11-23 NOTE — Anesthesia Preprocedure Evaluation (Addendum)
Anesthesia Evaluation  Patient identified by MRN, date of birth, ID band Patient awake    Reviewed: Allergy & Precautions, H&P , NPO status , Patient's Chart, lab work & pertinent test results  History of Anesthesia Complications Negative for: history of anesthetic complications  Airway Mallampati: I TM Distance: >3 FB Neck ROM: Full    Dental  (+) Teeth Intact and Dental Advisory Given   Pulmonary neg pulmonary ROS,  breath sounds clear to auscultation  Pulmonary exam normal       Cardiovascular negative cardio ROS  Rhythm:Regular Rate:Normal     Neuro/Psych negative neurological ROS  negative psych ROS   GI/Hepatic negative GI ROS, Neg liver ROS,   Endo/Other  Morbid obesity  Renal/GU negative Renal ROS     Musculoskeletal   Abdominal (+) + obese,   Peds  Hematology negative hematology ROS (+)   Anesthesia Other Findings   Reproductive/Obstetrics                         Anesthesia Physical Anesthesia Plan  ASA: II  Anesthesia Plan: MAC and Bier Block   Post-op Pain Management:    Induction:   Airway Management Planned: Simple Face Mask and Natural Airway  Additional Equipment:   Intra-op Plan:   Post-operative Plan:   Informed Consent: I have reviewed the patients History and Physical, chart, labs and discussed the procedure including the risks, benefits and alternatives for the proposed anesthesia with the patient or authorized representative who has indicated his/her understanding and acceptance.   Dental advisory given  Plan Discussed with: Surgeon and CRNA  Anesthesia Plan Comments: (Plan routine monitors, IV Regional Lidocaine with MAC)        Anesthesia Quick Evaluation

## 2012-11-23 NOTE — Brief Op Note (Signed)
11/23/2012  9:09 AM  PATIENT:  Nicole Cooper  47 y.o. female  PRE-OPERATIVE DIAGNOSIS:  CARPAL TUNNEL SYNDROME LEFT  POST-OPERATIVE DIAGNOSIS:  CARPAL TUNNEL SYNDROME LEFT  PROCEDURE:  Procedure(s): LEFT CARPAL TUNNEL RELEASE (Left)  SURGEON:  Surgeon(s) and Role:    * Nicki Reaper, MD - Primary  PHYSICIAN ASSISTANT:   ASSISTANTS: none   ANESTHESIA:   local and regional  EBL:  Total I/O In: 700 [I.V.:700] Out: -   BLOOD ADMINISTERED:none  DRAINS: none   LOCAL MEDICATIONS USED:  MARCAINE     SPECIMEN:  No Specimen  DISPOSITION OF SPECIMEN:  N/A  COUNTS:  YES  TOURNIQUET:   Total Tourniquet Time Documented: Forearm (Left) - 21 minutes Total: Forearm (Left) - 21 minutes   DICTATION: .Other Dictation: Dictation Number [id number]  PLAN OF CARE: Discharge to home after PACU  PATIENT DISPOSITION:  PACU - hemodynamically stable.

## 2012-11-23 NOTE — Anesthesia Postprocedure Evaluation (Signed)
  Anesthesia Post-op Note  Patient: Nicole Cooper  Procedure(s) Performed: Procedure(s): LEFT CARPAL TUNNEL RELEASE (Left)  Patient Location: PACU  Anesthesia Type:Bier block  Level of Consciousness: awake, alert , oriented and patient cooperative  Airway and Oxygen Therapy: Patient Spontanous Breathing  Post-op Pain: none  Post-op Assessment: Post-op Vital signs reviewed, Patient's Cardiovascular Status Stable, Respiratory Function Stable, Patent Airway, No signs of Nausea or vomiting and Pain level controlled  Post-op Vital Signs: Reviewed and stable  Complications: No apparent anesthesia complications

## 2012-11-23 NOTE — Op Note (Signed)
Nicole Cooper, Nicole Cooper              ACCOUNT NO.:  [id number]  MEDICAL RECORD NO.:  [id number]  LOCATION:                                 FACILITY:  PHYSICIAN:  Cindee Salt, M.D.       DATE OF BIRTH:  June 06, 1966  DATE OF PROCEDURE:  11/23/2012 DATE OF DISCHARGE:                              OPERATIVE REPORT   PREOPERATIVE DIAGNOSIS:  Carpal tunnel syndrome, left hand.  POSTOPERATIVE DIAGNOSIS:  Carpal tunnel syndrome, left hand.  OPERATION:  Decompression of left median nerve.  SURGEON:  Cindee Salt, M.D.  ANESTHESIA:  Forearm-based IV regional with local infiltration.  ANESTHESIOLOGIST:  Germaine Pomfret, M.D.  HISTORY:  The patient is a 47 year old female with a history of carpal tunnel syndrome.  Nerve conduction is positive.  She has elected to undergo surgical release.  Pre, peri, and postoperative course have been discussed along with risks, complications as well as alternative treatments.  In the preoperative area, the patient is seen, the extremity marked by both patient and surgeon, and antibiotic given.  She is well aware that there is no guarantee with the surgery; possibility of infection; recurrence of injury to arteries, nerves, tendons; incomplete relief of symptoms and dystrophy.  PROCEDURE:  The patient was brought to the operating room where a forearm IV regional anesthetic was carried out without difficulty.  She was prepped using ChloraPrep, supine position with the left arm free.  A 3-minute dry time was allowed.  Time-out taken, confirming the patient and procedure.  A longitudinal incision was then made in the palm, carried down through the subcutaneous tissue.  Bleeders were electrocauterized with bipolar.  The palmar fascia was split.  The superficial palmar arch was identified.  The flexor tendon to the ring and little finger was identified.  Retractors were placed to the ulnar side of the median nerve.  The carpal retinaculum was incised with  sharp dissection.  Right angle and Sewall retractor were placed between the skin and forearm fascia.  The distal forearm fascia was released for approximately a 1.5 cm proximal to the wrist crease under direct vision. Canal was explored, persistent median artery was present.  Area of compression to the nerve was immediately apparent.  No further lesions were identified.  The wound was copiously irrigated with saline and the skin was closed with interrupted 4-0 Vicryl Rapide sutures.  Local infiltration with 0.25% Marcaine was then injected into the incision area.  A sterile compressive dressing was applied with the fingers free. On deflation of the tourniquet, all fingers were immediately pinked. She was taken to the recovery room for observation in satisfactory condition.  She will be discharged to home to return in the [facility] [place] [facility] in 1 week, on Norco.          ______________________________ Cindee Salt, M.D.     GK/MEDQ  D:  11/23/2012  T:  11/23/2012  Job:  [id number]

## 2012-11-23 NOTE — H&P (Signed)
Nicole Cooper is a 46 year-old  who comes in complaining of numbness and tingling in both hands. She is left-hand dominant, left greater than right.  It is bothering her.  She complains of numbness and tingling.  She had carpal tunnel syndrome which I saw her for ten years ago during her pregnancy. This resolved until approximately a year ago when it began to return.  She is complaining of numbness, tingling, burning pain, left greater than right, all fingers.  This awakens her 7 out of 7 nights.  She has no history of injury to the hand or to the neck. She complains of constant, severe burning pain and states that it is getting worse.  Driving, putting on makeup, talking on the phone increases her symptoms. She has no history of diabetes, thyroid problems, arthritis or gout.  She has been taking ibuprofen at night to help her sleep.    ALLERGIES:   Sulfa.  MEDICATIONS:    Estratest and HCTZ.  SURGICAL HISTORY:     She had a cyst removed from her ovary, (2) D&C's, partial hysterectomy and oophorectomy.  FAMILY MEDICAL HISTORY:    Negative.  SOCIAL HISTORY:     She does not smoke or drink.  She is married.  Self-employed  REVIEW OF SYSTEMS:   Negative 14 points. Nicole Cooper is an 47 y.o. female.   Chief Complaint: cts lt HPI: see above   Past Medical History  Diagnosis Date  . Carpal tunnel syndrome of left wrist 11/2012  . Post-nasal drip     year-round  . Hx of bronchitis   . Dental crowns present     x 2    Past Surgical History  Procedure Laterality Date  . Ovarian cyst removal      age 81  . Cesarean section    . Dilation and curettage of uterus    . Partial hysterectomy    . Salpingoophorectomy      History reviewed. No pertinent family history. Social History:  reports that she has never smoked. She has never used smokeless tobacco. She reports that she does not drink alcohol or use illicit drugs.  Allergies:  Allergies  Allergen Reactions  . Sulfa  Antibiotics Rash    Medications Prior to Admission  Medication Sig Dispense Refill  . cetirizine (ZYRTEC) 10 MG tablet Take 10 mg by mouth daily.      . hydrochlorothiazide (HYDRODIURIL) 25 MG tablet Take 25 mg by mouth daily.        Results for orders placed during the hospital encounter of 11/23/12 (from the past 48 hour(s))  BASIC METABOLIC PANEL     Status: Abnormal   Collection Time    11/21/12 12:00 PM      Result Value Range   Sodium 139  135 - 145 mEq/L   Potassium 3.6  3.5 - 5.1 mEq/L   Chloride 100  96 - 112 mEq/L   CO2 28  19 - 32 mEq/L   Glucose, Bld 106 (*) 70 - 99 mg/dL   BUN 16  6 - 23 mg/dL   Creatinine, Ser 9.60  0.50 - 1.10 mg/dL   Calcium 9.7  8.4 - 45.4 mg/dL   GFR calc non Af Amer 69 (*) >90 mL/min   GFR calc Af Amer 81 (*) >90 mL/min   Comment:            The eGFR has been calculated     using the CKD EPI equation.  This calculation has not been     validated in all clinical     situations.     eGFR's persistently     <90 mL/min signify     possible Chronic Kidney Disease.  POCT HEMOGLOBIN-HEMACUE     Status: None   Collection Time    11/23/12  7:29 AM      Result Value Range   Hemoglobin 13.7  12.0 - 15.0 g/dL    No results found.   Pertinent items are noted in HPI.  Blood pressure 118/81, pulse 85, temperature 98.3 F (36.8 C), temperature source Oral, resp. rate 18, height 5\' 4"  (1.626 m), weight 118.446 kg (261 lb 2 oz), SpO2 99.00%.  General appearance: alert, cooperative and appears stated age Head: Normocephalic, without obvious abnormality Neck: no JVD Resp: clear to auscultation bilaterally Cardio: regular rate and rhythm, S1, S2 normal, no murmur, click, rub or gallop GI: soft, non-tender; bowel sounds normal; no masses,  no organomegaly Extremities: extremities normal, atraumatic, no cyanosis or edema Pulses: 2+ and symmetric Skin: Skin color, texture, turgor normal. No rashes or lesions Neurologic: Grossly  normal Incision/Wound: na  Assessment/Plan  She has had her nerve conductions done by Dr. Johna Roles and this reveals that she has bilateral carpal tunnel with motor delay of greater than 5.8 on the left, 5.4 on the right with sensory delay of 3.2 bilaterally.  Her amplitudes are significantly diminished to 10 and 17. We have discussed with her the possibility of surgical decompression. She would like to proceed to have that done.  She is tired of the problems. She would like to have her left side done.  She is aware that there is no guarantee with the surgery, possibility of infection, recurrence, injury to arteries, nerves, tendons, incomplete relief of symptoms and dystrophy.     Nicole Cooper R 11/23/2012, 8:30 AM

## 2012-11-23 NOTE — Anesthesia Procedure Notes (Addendum)
Procedure Name: MAC Date/Time: 11/23/2012 8:37 AM Performed by: Burna Cash Pre-anesthesia Checklist: Patient identified, Emergency Drugs available, Suction available, Patient being monitored and Timeout performed Patient Re-evaluated:Patient Re-evaluated prior to inductionOxygen Delivery Method: Simple face mask    Anesthesia Regional Block:  Bier block (IV Regional)  Pre-Anesthetic Checklist: ,, timeout performed, Correct Patient, Correct Site, Correct Laterality, Correct Procedure,, site marked, surgical consent,, at surgeon's request Needles:  Injection technique: Single-shot  Needle Type: Other      Needle Gauge: 20 and 20 G    Additional Needles: Bier block (IV Regional) Narrative:   Performed by: Personally   Bier block (IV Regional) Anesthesia Regional Block:  Bier block (IV Regional)  Pre-Anesthetic Checklist: ,, timeout performed, Correct Patient, Correct Site, Correct Laterality, Correct Procedure,, site marked, surgical consent,, at surgeon's request  Laterality: Left     Needles:  Injection technique: Single-shot  Needle Type: Other      Needle Gauge: 22 and 22 G    Additional Needles: Bier block (IV Regional) Narrative:  Injection made incrementally with aspirations every 30 mL.  Performed by: Personally   Bier block (IV Regional)

## 2012-11-23 NOTE — Op Note (Signed)
Dictation Number [id number]

## 2012-11-24 ENCOUNTER — Encounter (HOSPITAL_BASED_OUTPATIENT_CLINIC_OR_DEPARTMENT_OTHER): Payer: Self-pay | Admitting: Orthopedic Surgery

## 2012-12-05 ENCOUNTER — Other Ambulatory Visit: Payer: Self-pay | Admitting: Orthopedic Surgery

## 2012-12-15 DIAGNOSIS — G5601 Carpal tunnel syndrome, right upper limb: Secondary | ICD-10-CM

## 2012-12-15 HISTORY — DX: Carpal tunnel syndrome, right upper limb: G56.01

## 2012-12-16 ENCOUNTER — Encounter (HOSPITAL_BASED_OUTPATIENT_CLINIC_OR_DEPARTMENT_OTHER): Payer: Self-pay | Admitting: *Deleted

## 2012-12-16 NOTE — Pre-Procedure Instructions (Signed)
To come for BMET 

## 2012-12-19 ENCOUNTER — Encounter (HOSPITAL_BASED_OUTPATIENT_CLINIC_OR_DEPARTMENT_OTHER)
Admission: RE | Admit: 2012-12-19 | Discharge: 2012-12-19 | Disposition: A | Discharge status: Home / Self Care | Payer: BC Managed Care – PPO | Source: Ambulatory Visit | Attending: Orthopedic Surgery | Admitting: Orthopedic Surgery

## 2012-12-19 LAB — BASIC METABOLIC PANEL
BUN: 17 mg/dL (ref 6–23)
Calcium: 9.8 mg/dL (ref 8.4–10.5)
Chloride: 102 mEq/L (ref 96–112)
Creatinine, Ser: 0.73 mg/dL (ref 0.50–1.10)
GFR calc Af Amer: >90 mL/min (ref >90)
GFR calc non Af Amer: >90 mL/min (ref >90)

## 2012-12-21 ENCOUNTER — Ambulatory Visit (HOSPITAL_BASED_OUTPATIENT_CLINIC_OR_DEPARTMENT_OTHER)
Admission: RE | Admit: 2012-12-21 | Discharge: 2012-12-21 | Disposition: A | Discharge status: Home / Self Care | Payer: BC Managed Care – PPO | Source: Ambulatory Visit | Attending: Orthopedic Surgery | Admitting: Orthopedic Surgery

## 2012-12-21 ENCOUNTER — Encounter (HOSPITAL_BASED_OUTPATIENT_CLINIC_OR_DEPARTMENT_OTHER)
Admission: RE | Disposition: A | Discharge status: Home / Self Care | Payer: Self-pay | Source: Ambulatory Visit | Attending: Orthopedic Surgery

## 2012-12-21 ENCOUNTER — Encounter (HOSPITAL_BASED_OUTPATIENT_CLINIC_OR_DEPARTMENT_OTHER): Payer: Self-pay | Admitting: Orthopedic Surgery

## 2012-12-21 ENCOUNTER — Encounter (HOSPITAL_BASED_OUTPATIENT_CLINIC_OR_DEPARTMENT_OTHER): Payer: Self-pay | Admitting: Anesthesiology

## 2012-12-21 ENCOUNTER — Ambulatory Visit (HOSPITAL_BASED_OUTPATIENT_CLINIC_OR_DEPARTMENT_OTHER): Payer: BC Managed Care – PPO | Admitting: Anesthesiology

## 2012-12-21 DIAGNOSIS — Z882 Allergy status to sulfonamides status: Secondary | ICD-10-CM | POA: Insufficient documentation

## 2012-12-21 DIAGNOSIS — Z6841 Body Mass Index (BMI) 40.0 and over, adult: Secondary | ICD-10-CM | POA: Insufficient documentation

## 2012-12-21 DIAGNOSIS — G5600 Carpal tunnel syndrome, unspecified upper limb: Secondary | ICD-10-CM | POA: Insufficient documentation

## 2012-12-21 HISTORY — DX: Carpal tunnel syndrome, right upper limb: G56.01

## 2012-12-21 HISTORY — DX: Edema, unspecified: R60.9

## 2012-12-21 HISTORY — PX: CARPAL TUNNEL RELEASE: SHX101

## 2012-12-21 LAB — POCT HEMOGLOBIN-HEMACUE: Hemoglobin: 14.1 g/dL (ref 12.0–15.0)

## 2012-12-21 SURGERY — CARPAL TUNNEL RELEASE
Anesthesia: Monitor Anesthesia Care | Site: Wrist | Laterality: Right | Wound class: Clean

## 2012-12-21 MED ORDER — OXYCODONE HCL 5 MG/5ML PO SOLN: 5.0000 mg | Freq: Once | ORAL | Status: DC | PRN

## 2012-12-21 MED ORDER — HYDROCODONE-ACETAMINOPHEN 5-325 MG PO TABS: 1.0000 | ORAL_TABLET | Freq: Four times a day (QID) | ORAL | Status: DC | PRN

## 2012-12-21 MED ORDER — CEFAZOLIN SODIUM-DEXTROSE 2-3 GM-% IV SOLR
INTRAVENOUS | Status: DC | PRN
  Administered 2012-12-21: 2 g via INTRAVENOUS

## 2012-12-21 MED ORDER — MIDAZOLAM HCL 2 MG/2ML IJ SOLN: 1.0000 mg | INTRAMUSCULAR | Status: DC | PRN

## 2012-12-21 MED ORDER — CEFAZOLIN SODIUM-DEXTROSE 2-3 GM-% IV SOLR: 2.0000 g | INTRAVENOUS | Status: DC

## 2012-12-21 MED ORDER — ONDANSETRON HCL 4 MG/2ML IJ SOLN: 4.0000 mg | Freq: Once | INTRAMUSCULAR | Status: DC | PRN

## 2012-12-21 MED ORDER — ONDANSETRON HCL 4 MG/2ML IJ SOLN
INTRAMUSCULAR | Status: DC | PRN
  Administered 2012-12-21: 4 mg via INTRAVENOUS

## 2012-12-21 MED ORDER — OXYCODONE HCL 5 MG PO TABS: 5.0000 mg | ORAL_TABLET | Freq: Once | ORAL | Status: DC | PRN

## 2012-12-21 MED ORDER — LACTATED RINGERS IV SOLN
INTRAVENOUS | Status: DC
  Administered 2012-12-21: 09:00:00 via INTRAVENOUS
  Administered 2012-12-21: 20 mL/h via INTRAVENOUS

## 2012-12-21 MED ORDER — CHLORHEXIDINE GLUCONATE 4 % EX LIQD: 60.0000 mL | Freq: Once | CUTANEOUS | Status: DC

## 2012-12-21 MED ORDER — PROPOFOL 10 MG/ML IV EMUL
INTRAVENOUS | Status: DC | PRN
  Administered 2012-12-21: 100 ug/kg/min via INTRAVENOUS

## 2012-12-21 MED ORDER — BUPIVACAINE HCL (PF) 0.25 % IJ SOLN
INTRAMUSCULAR | Status: DC | PRN
  Administered 2012-12-21: 6 mL

## 2012-12-21 MED ORDER — FENTANYL CITRATE 0.05 MG/ML IJ SOLN
INTRAMUSCULAR | Status: DC | PRN
  Administered 2012-12-21 (×2): 50 ug via INTRAVENOUS

## 2012-12-21 MED ORDER — MIDAZOLAM HCL 5 MG/5ML IJ SOLN
INTRAMUSCULAR | Status: DC | PRN
  Administered 2012-12-21: 2 mg via INTRAVENOUS

## 2012-12-21 MED ORDER — LIDOCAINE HCL (PF) 0.5 % IJ SOLN
INTRAMUSCULAR | Status: DC | PRN
  Administered 2012-12-21: 30 mL via INTRAVENOUS

## 2012-12-21 MED ORDER — FENTANYL CITRATE 0.05 MG/ML IJ SOLN: 50.0000 ug | INTRAMUSCULAR | Status: DC | PRN

## 2012-12-21 MED ORDER — MIDAZOLAM HCL 2 MG/ML PO SYRP: 12.0000 mg | ORAL_SOLUTION | Freq: Once | ORAL | Status: DC | PRN

## 2012-12-21 MED ORDER — PROMETHAZINE HCL 25 MG/ML IJ SOLN
6.2500 mg | INTRAMUSCULAR | Status: DC | PRN
  Administered 2012-12-21: 6.25 mg via INTRAVENOUS

## 2012-12-21 MED ORDER — DEXAMETHASONE SODIUM PHOSPHATE 4 MG/ML IJ SOLN
8.0000 mg | Freq: Once | INTRAMUSCULAR | Status: AC
  Administered 2012-12-21: 8 mg via INTRAVENOUS

## 2012-12-21 MED ORDER — HYDROMORPHONE HCL PF 1 MG/ML IJ SOLN
0.2500 mg | INTRAMUSCULAR | Status: DC | PRN
  Administered 2012-12-21 (×3): 0.5 mg via INTRAVENOUS

## 2012-12-21 SURGICAL SUPPLY — 37 items
BANDAGE GAUZE ELAST BULKY 4 IN (GAUZE/BANDAGES/DRESSINGS) ×2 IMPLANT
BLADE SURG 15 STRL LF DISP TIS (BLADE) ×1 IMPLANT
BLADE SURG 15 STRL SS (BLADE) ×2
BNDG CMPR 9X4 STRL LF SNTH (GAUZE/BANDAGES/DRESSINGS) ×1
BNDG COHESIVE 3X5 TAN STRL LF (GAUZE/BANDAGES/DRESSINGS) ×2 IMPLANT
BNDG ESMARK 4X9 LF (GAUZE/BANDAGES/DRESSINGS) ×1 IMPLANT
CHLORAPREP W/TINT 26ML (MISCELLANEOUS) ×2 IMPLANT
CLOTH BEACON ORANGE TIMEOUT ST (SAFETY) ×2 IMPLANT
CORDS BIPOLAR (ELECTRODE) ×2 IMPLANT
COVER MAYO STAND STRL (DRAPES) ×2 IMPLANT
COVER TABLE BACK 60X90 (DRAPES) ×2 IMPLANT
CUFF TOURNIQUET SINGLE 18IN (TOURNIQUET CUFF) ×2 IMPLANT
DRAPE EXTREMITY T 121X128X90 (DRAPE) ×2 IMPLANT
DRAPE SURG 17X23 STRL (DRAPES) ×2 IMPLANT
DRSG KUZMA FLUFF (GAUZE/BANDAGES/DRESSINGS) ×2 IMPLANT
GAUZE XEROFORM 1X8 LF (GAUZE/BANDAGES/DRESSINGS) ×2 IMPLANT
GLOVE BIO SURGEON STRL SZ 6.5 (GLOVE) ×2 IMPLANT
GLOVE BIOGEL PI IND STRL 8.5 (GLOVE) ×1 IMPLANT
GLOVE BIOGEL PI INDICATOR 8.5 (GLOVE) ×1
GLOVE SURG ORTHO 8.0 STRL STRW (GLOVE) ×2 IMPLANT
GOWN BRE IMP PREV XXLGXLNG (GOWN DISPOSABLE) ×2 IMPLANT
GOWN PREVENTION PLUS XLARGE (GOWN DISPOSABLE) ×2 IMPLANT
NEEDLE 27GAX1X1/2 (NEEDLE) ×1 IMPLANT
NS IRRIG 1000ML POUR BTL (IV SOLUTION) ×2 IMPLANT
PACK BASIN DAY SURGERY FS (CUSTOM PROCEDURE TRAY) ×2 IMPLANT
PAD CAST 3X4 CTTN HI CHSV (CAST SUPPLIES) ×1 IMPLANT
PADDING CAST ABS 4INX4YD NS (CAST SUPPLIES) ×1
PADDING CAST ABS COTTON 4X4 ST (CAST SUPPLIES) ×1 IMPLANT
PADDING CAST COTTON 3X4 STRL (CAST SUPPLIES) ×2
SPONGE GAUZE 4X4 12PLY (GAUZE/BANDAGES/DRESSINGS) ×2 IMPLANT
STOCKINETTE 4X48 STRL (DRAPES) ×2 IMPLANT
SUT VICRYL 4-0 PS2 18IN ABS (SUTURE) IMPLANT
SUT VICRYL RAPIDE 4/0 PS 2 (SUTURE) ×2 IMPLANT
SYR BULB 3OZ (MISCELLANEOUS) ×2 IMPLANT
SYR CONTROL 10ML LL (SYRINGE) ×1 IMPLANT
TOWEL OR 17X24 6PK STRL BLUE (TOWEL DISPOSABLE) ×2 IMPLANT
UNDERPAD 30X30 INCONTINENT (UNDERPADS AND DIAPERS) ×2 IMPLANT

## 2012-12-21 NOTE — H&P (Signed)
Nicole Cooper is a 47 year-old female complaining of numbness and tingling in both hands. She is left-hand dominant, left greater than right.  It is bothering her.  She complains of numbness and tingling.  She had carpal tunnel syndrome which I saw her for ten years ago during her pregnancy. This resolved until approximately a year ago when it began to return.  She is complaining of numbness, tingling, burning pain, left greater than right, all fingers.  This awakens her 7 out of 7 nights.  She has no history of injury to the hand or to the neck. She complains of constant, severe burning pain and states that it is getting worse.  Driving, putting on makeup, talking on the phone increases her symptoms. She has no history of diabetes, thyroid problems, arthritis or gout.  She has been taking ibuprofen at night to help her sleep.    ALLERGIES:   Sulfa.  MEDICATIONS:    Estratest and HCTZ.  SURGICAL HISTORY:     She had a cyst removed from her ovary, (2) D&C's, partial hysterectomy and oophorectomy.  FAMILY MEDICAL HISTORY:    Negative.  SOCIAL HISTORY:     She does not smoke or drink.  She is married.  Self-employed  REVIEW OF SYSTEMS:   Negative 14 points. Nicole Cooper is an 47 y.o. female.   Chief Complaint: Cts rt HPI: see above  Past Medical History  Diagnosis Date  . Post-nasal drip     year-round  . Hx of bronchitis   . Dental crowns present     x 2  . 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 Fluid retention     takes HCTZ prn    Past Surgical History  Procedure Laterality Date  . Ovarian cyst removal      age 28  . Cesarean section  12/21/2003  . Salpingoophorectomy  2010  . Carpal tunnel release Left 11/23/2012    Procedure: LEFT CARPAL TUNNEL RELEASE;  Surgeon: Nicki Reaper, MD;  Location: [facility];  Service: Orthopedics;  Laterality: Left;  . Laparoscopic assisted vaginal hysterectomy  10/25/2007  . Dilation and curettage of uterus  2003    x 2   . Wisdom tooth extraction      as a teenager    History reviewed. No pertinent family history. Social History:  reports that she has never smoked. She has never used smokeless tobacco. She reports that she does not drink alcohol or use illicit drugs.  Allergies:  Allergies  Allergen Reactions  . Sulfa Antibiotics Rash    No prescriptions prior to admission    Results for orders placed during the hospital encounter of 12/21/12 (from the past 48 hour(s))  BASIC METABOLIC PANEL     Status: Abnormal   Collection Time    12/19/12 12:00 PM      Result Value Range   Sodium 139  135 - 145 mEq/L   Potassium 4.2  3.5 - 5.1 mEq/L   Chloride 102  96 - 112 mEq/L   CO2 26  19 - 32 mEq/L   Glucose, Bld 110 (*) 70 - 99 mg/dL   BUN 17  6 - 23 mg/dL   Creatinine, Ser 4.09  0.50 - 1.10 mg/dL   Calcium 9.8  8.4 - 81.1 mg/dL   GFR calc non Af Amer >90  >90 mL/min   GFR calc Af Amer >90  >90 mL/min   Comment:  The eGFR has been calculated     using the CKD EPI equation.     This calculation has not been     validated in all clinical     situations.     eGFR's persistently     <90 mL/min signify     possible Chronic Kidney Disease.    No results found.   Pertinent items are noted in HPI.  Height 5\' 4"  (1.626 m), weight 118.389 kg (261 lb).  General appearance: alert, cooperative and appears stated age Head: Normocephalic, without obvious abnormality Neck: no JVD Resp: clear to auscultation bilaterally Cardio: regular rate and rhythm, S1, S2 normal, no murmur, click, rub or gallop GI: soft, non-tender; bowel sounds normal; no masses,  no organomegaly Extremities: extremities normal, atraumatic, no cyanosis or edema Pulses: 2+ and symmetric Skin: Skin color, texture, turgor normal. No rashes or lesions Neurologic: Alert and oriented X 3, normal strength and tone. Normal symmetric reflexes. Normal coordination and gait Incision/Wound: na  Assessment/Plan RADIOGRAPHS:     X-rays of her hands are negative.  DIAGNOSIS:      Bilateral carpal tunnel syndrome.   NCV: positive for CTSbil Plan: CTR rt  Demico Ploch R 12/21/2012, 7:50 AM

## 2012-12-21 NOTE — Transfer of Care (Signed)
Immediate Anesthesia Transfer of Care Note  Patient: Nicole Cooper  Procedure(s) Performed: Procedure(s): CARPAL TUNNEL RELEASE (Right)  Patient Location: PACU  Anesthesia Type:Bier block  Level of Consciousness: alert  and oriented  Airway & Oxygen Therapy: Patient Spontanous Breathing  Post-op Assessment: Report given to PACU RN and Post -op Vital signs reviewed and stable  Post vital signs: Reviewed and stable  Complications: No apparent anesthesia complications

## 2012-12-21 NOTE — Anesthesia Procedure Notes (Signed)
Anesthesia Regional Block:  Bier block (IV Regional)  Pre-Anesthetic Checklist: ,, timeout performed, Correct Patient, Correct Site, Correct Laterality, Correct Procedure,, site marked, surgical consent,, at surgeon's request  Laterality: Right     Needles:  Injection technique: Single-shot  Needle Type: Other      Needle Gauge: 22 and 22 G    Additional Needles: Bier block (IV Regional) Narrative:   Performed by: With CRNAs   Bier block (IV Regional)   

## 2012-12-21 NOTE — Brief Op Note (Signed)
12/21/2012  10:05 AM  PATIENT:  Nicole Cooper  47 y.o. female  PRE-OPERATIVE DIAGNOSIS:  RIGHT CARPAL TUNNEL SYNDROME  POST-OPERATIVE DIAGNOSIS:  RIGHT CARPAL TUNNESYNDROME  PROCEDURE:  Procedure(s): CARPAL TUNNEL RELEASE (Right)  SURGEON:  Surgeon(s) and Role:    * Nicki Reaper, MD - Primary  PHYSICIAN ASSISTANT:   ASSISTANTS: none   ANESTHESIA:   local and regional  EBL:  Total I/O In: 600 [I.V.:600] Out: -   BLOOD ADMINISTERED:none  DRAINS: none   LOCAL MEDICATIONS USED:  MARCAINE     SPECIMEN:  No Specimen  DISPOSITION OF SPECIMEN:  N/A  COUNTS:  YES  TOURNIQUET:   Total Tourniquet Time Documented: Upper Arm (Right) - 6 minutes Total: Upper Arm (Right) - 6 minutes   DICTATION: .Other Dictation: Dictation Number [id number]  PLAN OF CARE: Discharge to home after PACU  PATIENT DISPOSITION:  PACU - hemodynamically stable.

## 2012-12-21 NOTE — Anesthesia Postprocedure Evaluation (Signed)
  Anesthesia Post-op Note  Patient: Nicole Cooper  Procedure(s) Performed: Procedure(s): CARPAL TUNNEL RELEASE (Right)  Patient Location: PACU  Anesthesia Type:MAC and Bier block  Level of Consciousness: awake, alert  and oriented  Airway and Oxygen Therapy: Patient Spontanous Breathing  Post-op Pain: mild  Post-op Assessment: Post-op Vital signs reviewed  Post-op Vital Signs: Reviewed  Complications: No apparent anesthesia complications

## 2012-12-21 NOTE — Anesthesia Preprocedure Evaluation (Signed)
Anesthesia Evaluation  Patient identified by MRN, date of birth, ID band Patient awake    Reviewed: Allergy & Precautions, H&P , NPO status , Patient's Chart, lab work & pertinent test results  Airway Mallampati: I TM Distance: >3 FB Neck ROM: Full    Dental  (+) Teeth Intact and Dental Advisory Given   Pulmonary  breath sounds clear to auscultation        Cardiovascular Rhythm:Regular Rate:Normal     Neuro/Psych    GI/Hepatic   Endo/Other  Morbid obesity  Renal/GU      Musculoskeletal   Abdominal   Peds  Hematology   Anesthesia Other Findings   Reproductive/Obstetrics                           Anesthesia Physical Anesthesia Plan  ASA: II  Anesthesia Plan: Bier Block and MAC   Post-op Pain Management:    Induction: Intravenous  Airway Management Planned: Simple Face Mask  Additional Equipment:   Intra-op Plan:   Post-operative Plan:   Informed Consent:   Dental advisory given  Plan Discussed with: CRNA, Anesthesiologist and Surgeon  Anesthesia Plan Comments:         Anesthesia Quick Evaluation

## 2012-12-21 NOTE — Op Note (Signed)
Dictation Number [id number]

## 2012-12-22 ENCOUNTER — Encounter (HOSPITAL_BASED_OUTPATIENT_CLINIC_OR_DEPARTMENT_OTHER): Payer: Self-pay | Admitting: Orthopedic Surgery

## 2012-12-22 NOTE — Op Note (Signed)
Nicole Cooper, Nicole Cooper               ACCOUNT NO.:  [id number]  MEDICAL RECORD NO.:  [id number]  LOCATION:                                 FACILITY:  PHYSICIAN:  Cindee Salt, M.D.            DATE OF BIRTH:  DATE OF PROCEDURE:  12/21/2012 DATE OF DISCHARGE:                              OPERATIVE REPORT   PREOPERATIVE DIAGNOSIS:  Carpal tunnel syndrome, right hand.  POSTOPERATIVE DIAGNOSIS:  Carpal tunnel syndrome, right hand.  OPERATION:  Decompression right median nerve.  SURGEON:  Cindee Salt, MD  ASSISTANT:  None.  ANESTHESIA:  Forearm-based IV regional with local infiltration.  ANESTHESIOLOGIST:  Sheldon Silvan, M.D.  HISTORY:  The patient is a 47 year old female with a history of carpal tunnel syndrome.  Nerve conductions are positive.  She has undergone release on her left side, it has been now for release on right.  Pre, peri, and postoperative course have been discussed along with risks and complications.  She is aware there is no guarantee with the surgery; possibility of infection; recurrence of injury to arteries, nerves, tendons, incomplete relief of symptoms, dystrophy.  In the preoperative area, the patient is seen.  The extremity marked by both the patient and surgeon.  Antibiotic given.  PROCEDURE IN DETAIL:  The patient was brought to the operating room where a forearm-based IV regional anesthetic was carried out without difficulty.  She was prepped using ChloraPrep, supine position, right arm free.  A 3-minute dry time was allowed.  Time-out taken confirming the patient and procedure.  A longitudinal incision was made in the right palm carried down through subcutaneous tissue.  Bleeders were electrocauterized.  Palmar fascia was split.  Superficial palmar arch was identified.  Flexor tendon of the ring little finger identified to the ulnar side of the median nerve.  Carpal retinaculum was incised with sharp dissection.  Right angle and Sewall retractor were placed  between skin and forearm fascia.  The fascia released for approximately a centimeter and half proximal to the wrist crease under direct vision. Canal was explored.  Area of compression to the nerve was apparent.  No further lesions were identified.  The wound was irrigated.  The skin was closed with interrupted 4-0 Vicryl Rapide sutures. Sterile compressive dressing was applied with the fingers free.  On deflation of the tourniquet, all fingers immediately pinked.  She was taken to the recovery room for observation in satisfactory condition. She will be discharged home to return to the [facility] [place] in 1 week on Norco.          ______________________________ Cindee Salt, M.D.     GK/MEDQ  D:  12/21/2012  T:  12/22/2012  Job:  [id number]

## 2013-04-21 ENCOUNTER — Other Ambulatory Visit: Payer: Self-pay

## 2013-04-21 DIAGNOSIS — Z1231 Encounter for screening mammogram for malignant neoplasm of breast: Secondary | ICD-10-CM

## 2013-04-25 ENCOUNTER — Ambulatory Visit: Payer: BC Managed Care – PPO

## 2013-05-16 ENCOUNTER — Ambulatory Visit: Payer: BC Managed Care – PPO

## 2013-06-16 ENCOUNTER — Ambulatory Visit
Admission: RE | Admit: 2013-06-16 | Discharge: 2013-06-16 | Disposition: A | Discharge status: Home / Self Care | Payer: BC Managed Care – PPO | Source: Ambulatory Visit

## 2013-06-16 DIAGNOSIS — Z1231 Encounter for screening mammogram for malignant neoplasm of breast: Secondary | ICD-10-CM

## 2013-06-22 ENCOUNTER — Other Ambulatory Visit: Payer: Self-pay

## 2013-08-18 ENCOUNTER — Emergency Department (HOSPITAL_COMMUNITY)
Admission: EM | Admit: 2013-08-18 | Discharge: 2013-08-18 | Disposition: A | Discharge status: Home / Self Care | Payer: BC Managed Care – PPO | Attending: Emergency Medicine | Admitting: Emergency Medicine

## 2013-08-18 ENCOUNTER — Encounter (HOSPITAL_COMMUNITY): Payer: Self-pay | Admitting: Emergency Medicine

## 2013-08-18 DIAGNOSIS — Z79899 Other long term (current) drug therapy: Secondary | ICD-10-CM | POA: Insufficient documentation

## 2013-08-18 DIAGNOSIS — M542 Cervicalgia: Secondary | ICD-10-CM

## 2013-08-18 DIAGNOSIS — Z8709 Personal history of other diseases of the respiratory system: Secondary | ICD-10-CM | POA: Insufficient documentation

## 2013-08-18 DIAGNOSIS — G8911 Acute pain due to trauma: Secondary | ICD-10-CM | POA: Insufficient documentation

## 2013-08-18 MED ORDER — METHOCARBAMOL 500 MG PO TABS: 500.0000 mg | ORAL_TABLET | Freq: Once | ORAL | Status: DC

## 2013-08-18 MED ORDER — HYDROCODONE-ACETAMINOPHEN 5-325 MG PO TABS
2.0000 | ORAL_TABLET | Freq: Once | ORAL | Status: AC
  Administered 2013-08-18: 2 via ORAL
  Filled 2013-08-18: qty 2

## 2013-08-18 MED ORDER — HYDROCODONE-ACETAMINOPHEN 5-325 MG PO TABS: 2.0000 | ORAL_TABLET | Freq: Once | ORAL | Status: DC

## 2013-08-18 MED ORDER — METHOCARBAMOL 500 MG PO TABS
500.0000 mg | ORAL_TABLET | Freq: Once | ORAL | Status: AC
  Administered 2013-08-18: 500 mg via ORAL
  Filled 2013-08-18: qty 1

## 2013-08-18 MED ORDER — TRAMADOL HCL 50 MG PO TABS: 50.0000 mg | ORAL_TABLET | Freq: Once | ORAL | Status: DC

## 2013-08-18 NOTE — ED Provider Notes (Signed)
CSN: [id number]     Arrival date & time 08/18/13  1920 History   First MD Initiated Contact with Patient 08/18/13 2055     Chief Complaint  Patient presents with  . Headache   (Consider location/radiation/quality/duration/timing/severity/associated sxs/prior Treatment) HPI Comments: Patient is a 48 year old female who presents with right side neck pain for the past week. Symptoms started suddenly when patient was doing her hair. She reports turning her neck and felt a "pop" and sudden onset of aching pain. The pain has been constant since the onset. She reports going to Urgent Care where she was diagnosed with mastoiditis. She finished her antibiotics and was given a steroid shot without relief. Patient reports having the flu prior to her mastoiditis diagnosis. She is concerned about an infection in her neck that is worsening since the previous interventions have not helped. Patient denies fever, visual changes, dizziness.    Past Medical History  Diagnosis Date  . Post-nasal drip     year-round  . Hx of bronchitis   . Dental crowns present     x 2  . 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 Fluid retention     takes HCTZ prn   Past Surgical History  Procedure Laterality Date  . Ovarian cyst removal      age 24  . Cesarean section  12/21/2003  . Salpingoophorectomy  2010  . Carpal tunnel release Left 11/23/2012    Procedure: LEFT CARPAL TUNNEL RELEASE;  Surgeon: Wynonia Sours, MD;  Location: [place];  Service: Orthopedics;  Laterality: Left;  . Laparoscopic assisted vaginal hysterectomy  10/25/2007  . Dilation and curettage of uterus  2003    x 2  . Wisdom tooth extraction      as a teenager  . Carpal tunnel release Right 12/21/2012    Procedure: CARPAL TUNNEL RELEASE;  Surgeon: Wynonia Sours, MD;  Location: [place];  Service: Orthopedics;  Laterality: Right;   No family history on file. History  Substance Use Topics  . Smoking status: Never  Smoker   . Smokeless tobacco: Never Used  . Alcohol Use: No   OB History   Grav Para Term Preterm Abortions TAB SAB Ect Mult Living                 Review of Systems  Constitutional: Negative for fever, chills and fatigue.  HENT: Negative for trouble swallowing.   Eyes: Negative for visual disturbance.  Respiratory: Negative for shortness of breath.   Cardiovascular: Negative for chest pain and palpitations.  Gastrointestinal: Negative for nausea, vomiting, abdominal pain and diarrhea.  Genitourinary: Negative for dysuria and difficulty urinating.  Musculoskeletal: Positive for neck pain. Negative for arthralgias.  Skin: Negative for color change.  Neurological: Negative for dizziness and weakness.  Psychiatric/Behavioral: Negative for dysphoric mood.    Allergies  Keflex; Percocet; and Sulfa antibiotics  Home Medications   Current Outpatient Rx  Name  Route  Sig  Dispense  Refill  . cetirizine (ZYRTEC) 10 MG tablet   Oral   Take 10 mg by mouth daily.         . hydrochlorothiazide (HYDRODIURIL) 25 MG tablet   Oral   Take 25 mg by mouth daily.         Marland Kitchen HYDROcodone-acetaminophen (NORCO) 5-325 MG per tablet   Oral   Take 1 tablet by mouth every 6 (six) hours as needed for pain.   30 tablet  0   . HYDROcodone-acetaminophen (NORCO) 5-325 MG per tablet   Oral   Take 1 tablet by mouth every 6 (six) hours as needed for pain.   30 tablet   0    BP 134/83  Pulse 98  Temp(Src) 98.3 F (36.8 C)  Resp 20  SpO2 100% Physical Exam  Nursing note and vitals reviewed. Constitutional: She is oriented to person, place, and time. She appears well-developed and well-nourished. No distress.  HENT:  Head: Normocephalic and atraumatic.  Right Ear: External ear normal.  Left Ear: External ear normal.  Nose: Nose normal.  Mouth/Throat: Oropharynx is clear and moist. No oropharyngeal exudate.  No mastoid tenderness to palpation bilaterally. Bilateral TM intact and without  edema or erythema.   Eyes: Conjunctivae and EOM are normal. Pupils are equal, round, and reactive to light.  Neck: Neck supple.  ROM slightly limited due to right side pain.   Cardiovascular: Normal rate and regular rhythm.  Exam reveals no gallop and no friction rub.   No murmur heard. Pulmonary/Chest: Effort normal and breath sounds normal. She has no wheezes. She has no rales. She exhibits no tenderness.  Abdominal: Soft. She exhibits no distension. There is no tenderness. There is no rebound and no guarding.  Musculoskeletal: Normal range of motion.  No midline spine tenderness to palpation. Right cervical paraspinal tenderness to palpation. No obvious deformity or localized swelling noted.   Lymphadenopathy:    She has no cervical adenopathy.  Neurological: She is alert and oriented to person, place, and time. Coordination normal.  Speech is goal-oriented. Moves limbs without ataxia.   Skin: Skin is warm and dry.  Psychiatric: She has a normal mood and affect. Her behavior is normal.    ED Course  Procedures (including critical care time) Labs Review Labs Reviewed - No data to display Imaging Review No results found.  EKG Interpretation   None       MDM   1. Neck pain on right side     9:09 PM Patient is a 48 year old female who presents with right side neck pain for the past week that started after she felt a "pop" in her neck while she was doing her hair. Patient reports being sick with influenza and mastoiditis for the past few weeks and was concerned about worsening infection. Patient vitals are stable and she is afebrile. Patient likely has a muscle strain based on my impression. She has no meningeal signs or other signs of infection. She has been on multiple antibiotics without relief. Patient advised to place heat on her neck. I will discharge her with Norco and robaxin for pain. Patient advised to return with worsening or concerning symptoms.     Alvina Chou, [place] 08/18/13 2122

## 2013-08-18 NOTE — ED Notes (Signed)
Started with sinus congestion/flu last wk; c/o pressure in back of head--cant turn head; can touch chin to chest; states when opens mouth has extreme pressure back of head; went to Urgent Care on Tuesday diagnosed with mastoiditis and put on antibiotic and steroid shot; no better; no dizziness; no fever

## 2013-08-18 NOTE — ED Notes (Signed)
Pt has a ride home.  

## 2013-08-18 NOTE — Discharge Instructions (Signed)
Take Vicodin as needed for pain. Take Robaxin as needed for muscle spasm. Apply heat to the affected area for as long as you can tolerate. This will help loosen the affected muscle. Refer to attached documents for more information. Return to the ED with worsening or concerning symptoms.

## 2013-08-30 NOTE — ED Provider Notes (Signed)
Medical screening examination/treatment/procedure(s) were performed by non-physician practitioner and as supervising physician I was immediately available for consultation/collaboration.  EKG Interpretation   None         Tanna Furry, MD 08/30/13 0002

## 2014-04-03 ENCOUNTER — Other Ambulatory Visit: Payer: Self-pay | Admitting: Obstetrics and Gynecology

## 2014-04-03 DIAGNOSIS — N63 Unspecified lump in unspecified breast: Secondary | ICD-10-CM

## 2014-04-06 ENCOUNTER — Other Ambulatory Visit: Payer: BC Managed Care – PPO

## 2014-04-11 ENCOUNTER — Ambulatory Visit
Admission: RE | Admit: 2014-04-11 | Discharge: 2014-04-11 | Disposition: A | Discharge status: Home / Self Care | Payer: BC Managed Care – PPO | Source: Ambulatory Visit | Attending: Obstetrics and Gynecology | Admitting: Obstetrics and Gynecology

## 2014-04-11 DIAGNOSIS — N63 Unspecified lump in unspecified breast: Secondary | ICD-10-CM

## 2015-04-19 DIAGNOSIS — Z8709 Personal history of other diseases of the respiratory system: Secondary | ICD-10-CM | POA: Insufficient documentation

## 2015-04-19 DIAGNOSIS — R0789 Other chest pain: Secondary | ICD-10-CM | POA: Insufficient documentation

## 2015-04-19 DIAGNOSIS — Z872 Personal history of diseases of the skin and subcutaneous tissue: Secondary | ICD-10-CM | POA: Insufficient documentation

## 2015-04-19 DIAGNOSIS — R609 Edema, unspecified: Secondary | ICD-10-CM | POA: Insufficient documentation

## 2015-04-19 DIAGNOSIS — E781 Pure hyperglyceridemia: Secondary | ICD-10-CM | POA: Insufficient documentation

## 2015-04-19 DIAGNOSIS — R0600 Dyspnea, unspecified: Secondary | ICD-10-CM | POA: Insufficient documentation

## 2015-04-19 DIAGNOSIS — R069 Unspecified abnormalities of breathing: Secondary | ICD-10-CM | POA: Insufficient documentation

## 2015-04-19 DIAGNOSIS — Z98811 Dental restoration status: Secondary | ICD-10-CM | POA: Insufficient documentation

## 2015-04-19 DIAGNOSIS — L659 Nonscarring hair loss, unspecified: Secondary | ICD-10-CM | POA: Insufficient documentation

## 2015-04-19 DIAGNOSIS — R0982 Postnasal drip: Secondary | ICD-10-CM | POA: Insufficient documentation

## 2015-04-19 DIAGNOSIS — I10 Essential (primary) hypertension: Secondary | ICD-10-CM | POA: Insufficient documentation

## 2015-04-23 ENCOUNTER — Encounter: Payer: Self-pay | Admitting: Cardiovascular Disease

## 2015-04-23 ENCOUNTER — Ambulatory Visit (INDEPENDENT_AMBULATORY_CARE_PROVIDER_SITE_OTHER): Payer: BLUE CROSS/BLUE SHIELD | Admitting: Cardiovascular Disease

## 2015-04-23 VITALS — BP 130/90 | HR 80 | Ht 64.0 in | Wt 263.2 lb

## 2015-04-23 DIAGNOSIS — R0600 Dyspnea, unspecified: Secondary | ICD-10-CM

## 2015-04-23 DIAGNOSIS — I10 Essential (primary) hypertension: Secondary | ICD-10-CM

## 2015-04-23 NOTE — Progress Notes (Signed)
Cardiology Office Note   Date:  04/23/2015   ID:  Nicole Cooper, DOB July 06, 1966, MRN [id number]  PCP:  Tawanna Solo, MD  Cardiologist:   Thayer Headings, MD   Chief Complaint  Patient presents with  . Chest Pain   Problem List 1. Essential HTN 2. Dyspnea  3. Elevated Triglycerides     History of Present Illness: Nicole Cooper is a 49 y.o. female who presents for evaluation of some dyspnea. She had skipped her HCTZ for 4 days.  Got back on it , lost 8 lbs of fluid in 4 days, And felt immediately better   Feels fine Has palpitations if she drinks too much caffeine. Walks some ,   Tries to eat low carb but she eats out quite often .   Past Medical History  Diagnosis Date  . Post-nasal drip     year-round  . Hx of bronchitis   . Dental crowns present     x 2  . 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 Fluid retention     takes HCTZ prn  . HTN (hypertension)   . Obesity   . High triglycerides   . Hair loss   . Allergy   . History of urticaria     COUGH  . Breathing problem   . Chest heaviness   . Dyspnea     Past Surgical History  Procedure Laterality Date  . Ovarian cyst removal      age 3  . Cesarean section  12/21/2003  . Salpingoophorectomy  2010  . Carpal tunnel release Left 11/23/2012    Procedure: LEFT CARPAL TUNNEL RELEASE;  Surgeon: Wynonia Sours, MD;  Location: [place];  Service: Orthopedics;  Laterality: Left;  . Laparoscopic assisted vaginal hysterectomy  10/25/2007  . Dilation and curettage of uterus  2003    x 2  . Wisdom tooth extraction      as a teenager  . Carpal tunnel release Right 12/21/2012    Procedure: CARPAL TUNNEL RELEASE;  Surgeon: Wynonia Sours, MD;  Location: [place];  Service: Orthopedics;  Laterality: Right;  . Vaginal hysterectomy    . Polyps removed from uterine       Current Outpatient Prescriptions  Medication Sig Dispense Refill  . estradiol (ESTRACE) 2 MG tablet  Take 2 mg by mouth daily.  4  . fenofibrate 54 MG tablet Take 54 mg by mouth daily.  2  . folic acid (FOLVITE) 1 MG tablet Take 1 mg by mouth daily.  1  . hydrochlorothiazide (HYDRODIURIL) 25 MG tablet Take 25 mg by mouth daily.     Marland Kitchen loratadine (CLARITIN) 10 MG tablet Take 20 mg by mouth daily. TAKE 2 TABLETS BY MOUTH DAILY  3  . methotrexate (RHEUMATREX) 2.5 MG tablet Take 2.5 mg by mouth once a week. TAKE 4 TABLETS BY MOUTH EVERY Thursday FOR ITCHING  2   No current facility-administered medications for this visit.    Allergies:   Keflex; Percocet; and Sulfa antibiotics    Social History:  The patient  reports that she has never smoked. She has never used smokeless tobacco. She reports that she does not drink alcohol or use illicit drugs.   Family History:  The patient's family history includes Cancer - Lung in her mother; Hyperlipidemia in her brother and father; Hypertension in her mother.    ROS:  Please see the history of present illness.  Review of Systems: Constitutional:  denies fever, chills, diaphoresis, appetite change and fatigue.  HEENT: denies photophobia, eye pain, redness, hearing loss, ear pain, congestion, sore throat, rhinorrhea, sneezing, neck pain, neck stiffness and tinnitus.  Respiratory: denies SOB, DOE, cough, chest tightness, and wheezing.  Cardiovascular: denies chest pain, palpitations and leg swelling.  Gastrointestinal: denies nausea, vomiting, abdominal pain, diarrhea, constipation, blood in stool.  Genitourinary: denies dysuria, urgency, frequency, hematuria, flank pain and difficulty urinating.  Musculoskeletal: denies  myalgias, back pain, joint swelling, arthralgias and gait problem.   Skin: denies pallor, rash and wound.  Neurological: denies dizziness, seizures, syncope, weakness, light-headedness, numbness and headaches.   Hematological: denies adenopathy, easy bruising, personal or family bleeding history.  Psychiatric/ Behavioral: denies  suicidal ideation, mood changes, confusion, nervousness, sleep disturbance and agitation.       All other systems are reviewed and negative.    PHYSICAL EXAM: VS:  BP 130/90 mmHg  Pulse 80  Ht 5\' 4"  (1.626 m)  Wt 119.387 kg (263 lb 3.2 oz)  BMI 45.16 kg/m2 , BMI Body mass index is 45.16 kg/(m^2). GEN: Well nourished, well developed, in no acute distress HEENT: normal Neck: no JVD, carotid bruits, or masses Cardiac: RRR; no murmurs, rubs, or gallops,no edema  Respiratory:  clear to auscultation bilaterally, normal work of breathing GI: soft, nontender, nondistended, + BS MS: no deformity or atrophy Skin: warm and dry, no rash Neuro:  Strength and sensation are intact Psych: normal   EKG:  EKG is ordered today. The ekg ordered today demonstrates NSR at 80.  No ST or T wave changes    Recent Labs: No results found for requested labs within last 365 days.    Lipid Panel No results found for: CHOL, TRIG, HDL, CHOLHDL, VLDL, LDLCALC, LDLDIRECT    Wt Readings from Last 3 Encounters:  04/23/15 119.387 kg (263 lb 3.2 oz)  12/21/12 120.838 kg (266 lb 6.4 oz)  11/23/12 118.446 kg (261 lb 2 oz)      Other studies Reviewed: Additional studies/ records that were reviewed today include:  Review of the above records demonstrates:    ASSESSMENT AND PLAN:  1.  Dyspnea:  She had transient episode of shortness of breath that occurred when she had skipped her HCTZ for several days while on vacation..  The dyspnea  resolved as soon as she started taking her HCTZ again.  At this point, I think she is stable and does not need any further cardiac testing .  2. Obesity:  We discussed diet and exercise. I've given him the contact info for a nutritionist - Amy Hager.   I will see her on an as needed basis .    Current medicines are reviewed at length with the patient today.  The patient does not have concerns regarding medicines.  The following changes have been made:  no  change  Labs/ tests ordered today include:  No orders of the defined types were placed in this encounter.     Disposition:   FU with me as needed.      Alyna Stensland, Wonda Cheng, MD  04/23/2015 10:38 AM    [facility] [place], [place], [place]  [address] Phone: [phone]; Fax: [phone]   [facility]  [address] [place], [place]  [address] [phone]   Fax [phone]

## 2015-04-23 NOTE — Patient Instructions (Addendum)
Estelle Grumbles - nutritionist    [phone]   Medication Instructions:  Your physician recommends that you continue on your current medications as directed. Please refer to the Current Medication list given to you today.   Labwork: None Ordered   Testing/Procedures: None Ordered   Follow-Up: Your physician recommends that you schedule a follow-up appointment in: as needed with Dr. Acie Fredrickson

## 2015-09-09 ENCOUNTER — Other Ambulatory Visit: Payer: Self-pay

## 2015-09-09 DIAGNOSIS — Z1231 Encounter for screening mammogram for malignant neoplasm of breast: Secondary | ICD-10-CM

## 2015-09-13 ENCOUNTER — Other Ambulatory Visit: Payer: Self-pay | Admitting: Obstetrics and Gynecology

## 2015-09-13 DIAGNOSIS — N6320 Unspecified lump in the left breast, unspecified quadrant: Principal | ICD-10-CM

## 2015-09-13 DIAGNOSIS — N6325 Unspecified lump in the left breast, overlapping quadrants: Secondary | ICD-10-CM

## 2015-09-16 ENCOUNTER — Ambulatory Visit
Admission: RE | Admit: 2015-09-16 | Discharge: 2015-09-16 | Disposition: A | Discharge status: Home / Self Care | Payer: BLUE CROSS/BLUE SHIELD | Source: Ambulatory Visit | Attending: Obstetrics and Gynecology | Admitting: Obstetrics and Gynecology

## 2015-09-16 DIAGNOSIS — N6320 Unspecified lump in the left breast, unspecified quadrant: Principal | ICD-10-CM

## 2015-09-16 DIAGNOSIS — N6325 Unspecified lump in the left breast, overlapping quadrants: Secondary | ICD-10-CM

## 2015-09-19 ENCOUNTER — Ambulatory Visit: Payer: BLUE CROSS/BLUE SHIELD

## 2015-11-15 DIAGNOSIS — J3089 Other allergic rhinitis: Secondary | ICD-10-CM | POA: Diagnosis not present

## 2015-11-21 LAB — HM PAP SMEAR: HM PAP: NEGATIVE

## 2016-03-03 ENCOUNTER — Ambulatory Visit (INDEPENDENT_AMBULATORY_CARE_PROVIDER_SITE_OTHER): Payer: BLUE CROSS/BLUE SHIELD | Admitting: Family Medicine

## 2016-03-03 ENCOUNTER — Encounter: Payer: Self-pay | Admitting: Family Medicine

## 2016-03-03 VITALS — BP 122/72 | HR 90 | Ht 64.75 in | Wt 265.0 lb

## 2016-03-03 DIAGNOSIS — Z90710 Acquired absence of both cervix and uterus: Secondary | ICD-10-CM

## 2016-03-03 DIAGNOSIS — E8881 Metabolic syndrome: Secondary | ICD-10-CM

## 2016-03-03 DIAGNOSIS — E282 Polycystic ovarian syndrome: Secondary | ICD-10-CM

## 2016-03-03 DIAGNOSIS — Z711 Person with feared health complaint in whom no diagnosis is made: Secondary | ICD-10-CM

## 2016-03-03 DIAGNOSIS — I10 Essential (primary) hypertension: Secondary | ICD-10-CM | POA: Diagnosis not present

## 2016-03-03 DIAGNOSIS — Z90721 Acquired absence of ovaries, unilateral: Secondary | ICD-10-CM

## 2016-03-03 DIAGNOSIS — E781 Pure hyperglyceridemia: Secondary | ICD-10-CM

## 2016-03-03 DIAGNOSIS — J3089 Other allergic rhinitis: Secondary | ICD-10-CM | POA: Insufficient documentation

## 2016-03-03 DIAGNOSIS — M722 Plantar fascial fibromatosis: Secondary | ICD-10-CM

## 2016-03-03 DIAGNOSIS — R5381 Other malaise: Secondary | ICD-10-CM

## 2016-03-03 DIAGNOSIS — E669 Obesity, unspecified: Secondary | ICD-10-CM

## 2016-03-03 DIAGNOSIS — K219 Gastro-esophageal reflux disease without esophagitis: Secondary | ICD-10-CM

## 2016-03-03 NOTE — Assessment & Plan Note (Signed)
No bare feet and MUST wear supportive footwear

## 2016-03-03 NOTE — Assessment & Plan Note (Signed)
Cont med; stable

## 2016-03-03 NOTE — Patient Instructions (Addendum)
Ardine Eng womens flip flops.  Must wera supportive shoes everyday. NEVER GO BAREFOOT.  Make sure you follow up with your GYN doctor Dr. Corinna Capra to see if there is a hormonal etiology to your excessive sweating symptoms as well- esp since had h/o hormonal ABN/ hair loss/ pcos etc.  Got a wear supportive shoes at all times, once you to walk 5 minutes twice daily

## 2016-03-03 NOTE — Assessment & Plan Note (Addendum)
Explained to patient that I did not appreciate thyromegaly or any masses or lumps. All felt within normal limits.  Await labs

## 2016-03-03 NOTE — Assessment & Plan Note (Signed)
May be reason for excessive sweating...await labs.

## 2016-03-03 NOTE — Assessment & Plan Note (Signed)
Per pt- BS always good; just really high TG- in her teens- in the 900's.  Has insulin resistance and tough losing wt per pt

## 2016-03-03 NOTE — Assessment & Plan Note (Signed)
Per pt- has been 900+ in past and up since she was child

## 2016-03-03 NOTE — Progress Notes (Signed)
[name], D.O. Primary care at [place]:    Chief Complaint  Patient presents with  . Establish Care  . Excessive Sweating   New pt, here to establish care.   HPI: Nicole Cooper is a pleasant 50 y.o. female who presents to [facility] today  tio become est  3 months ago started sweating a ton more than usual.  And at recent [place] at an UC for URI- told thyroid felt "enlarged," pt was told to f/up with PCP.      Pt has not had any routine medical care, just for acute issues that come up.  Seen by [facility] about one yr ago for palpitations and she felt weird sensation in chest- nothing found to be pathological.  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  2 stepkids, one son-12, 3 step grandkids.  Married 68yrs.   No exercise; poor diet, deconditioned.    Pt also c/o plantar fascitis L foot, at times R.  Comes and goes.  Wearing VERY poorly supportive flip flops today/ usually little to no support.        Past Medical History  Diagnosis Date  . Post-nasal drip     year-round  . Hx of bronchitis   . Dental crowns present     x 2  . 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 Fluid retention     takes HCTZ prn  . HTN (hypertension)   . Obesity   . High triglycerides   . Hair loss   . Allergy   . History of urticaria     COUGH  . Breathing problem   . Chest heaviness   . Dyspnea       Past Surgical History  Procedure Laterality Date  . Ovarian cyst removal      age 53  . Cesarean section  12/21/2003  . Salpingoophorectomy  2010  . Carpal tunnel release Left 11/23/2012    Procedure: LEFT CARPAL TUNNEL RELEASE;  Surgeon: Wynonia Sours, MD;  Location: [place];  Service: Orthopedics;  Laterality: Left;  . Laparoscopic assisted vaginal hysterectomy  10/25/2007  . Dilation and curettage of uterus  2003    x 2  . Wisdom  tooth extraction      as a teenager  . Carpal tunnel release Right 12/21/2012    Procedure: CARPAL TUNNEL RELEASE;  Surgeon: Wynonia Sours, MD;  Location: [place];  Service: Orthopedics;  Laterality: Right;  . Vaginal hysterectomy    . Polyps removed from uterine        Family History  Problem Relation Age of Onset  . Hyperlipidemia Father   . Cancer - Lung Mother   . Hypertension Mother   . Cancer Mother     lung  . Hyperlipidemia Brother   . Cancer Maternal Grandmother     breast      History  Drug Use No  ,    History  Alcohol Use No  ,    History  Smoking status  . Never Smoker   Smokeless tobacco  . Never Used  ,     History  Sexual Activity  . Sexual Activity: Yes  . Birth Control/ Protection: Surgical      Patient's Medications  New Prescriptions   No medications on  file  Previous Medications   FENOFIBRATE 54 MG TABLET    Take 54 mg by mouth daily.   HYDROCHLOROTHIAZIDE (HYDRODIURIL) 25 MG TABLET    Take 25 mg by mouth daily.    LORATADINE (CLARITIN) 10 MG TABLET    Take 20 mg by mouth daily. TAKE 2 TABLETS BY MOUTH DAILY   PREMARIN 0.625 MG TABLET    Take 0.625 mg by mouth daily.   RANITIDINE (ZANTAC) 150 MG CAPSULE    Take 150 mg by mouth daily.  Modified Medications   No medications on file  Discontinued Medications   ESTRADIOL (ESTRACE) 2 MG TABLET    Take 2 mg by mouth daily.   FOLIC ACID (FOLVITE) 1 MG TABLET    Take 1 mg by mouth daily.   METHOTREXATE (RHEUMATREX) 2.5 MG TABLET    Take 2.5 mg by mouth once a week. TAKE 4 TABLETS BY MOUTH EVERY Thursday FOR ITCHING     Keflex; Percocet; and Sulfa antibiotics Outpatient Encounter Prescriptions as of 03/03/2016  Medication Sig Note  . fenofibrate 54 MG tablet Take 54 mg by mouth daily. 04/23/2015: Received from: External Pharmacy Received Sig:   . hydrochlorothiazide (HYDRODIURIL) 25 MG tablet Take 25 mg by mouth daily.    Marland Kitchen loratadine (CLARITIN) 10 MG tablet Take 20 mg  by mouth daily. TAKE 2 TABLETS BY MOUTH DAILY 04/23/2015: Received from: External Pharmacy Received Sig: Take 20 mg by mouth daily.  Marland Kitchen PREMARIN 0.625 MG tablet Take 0.625 mg by mouth daily. 03/03/2016: Received from: External Pharmacy  . ranitidine (ZANTAC) 150 MG capsule Take 150 mg by mouth daily.   . [DISCONTINUED] estradiol (ESTRACE) 2 MG tablet Take 2 mg by mouth daily. 04/23/2015: Received from: External Pharmacy  . [DISCONTINUED] folic acid (FOLVITE) 1 MG tablet Take 1 mg by mouth daily. 04/23/2015: Received from: External Pharmacy Received Sig:   . [DISCONTINUED] methotrexate (RHEUMATREX) 2.5 MG tablet Take 2.5 mg by mouth once a week. TAKE 4 TABLETS BY MOUTH EVERY Thursday FOR ITCHING 04/23/2015: Received from: External Pharmacy Received Sig: TAKE 4 TABLETS EACH MORNING AND EVENING EVERY THURSDAY   No facility-administered encounter medications on file as of 03/03/2016.     Immunization History  Administered Date(s) Administered  . Tdap 07/15/2011      Review of Systems:   ( Completed via Adult Medical History Intake form today ) General:   Denies fever, chills, appetite changes, unexplained weight loss.  Optho/Auditory:   Denies visual changes, blurred vision/LOV, ringing in ears/ diff hearing Respiratory:   Denies SOB, DOE, cough, wheezing.  Cardiovascular:   Denies chest pain, palpitations, new onset peripheral edema  Gastrointestinal:   Denies nausea, vomiting, diarrhea.  Genitourinary:    Denies dysuria, increased frequency, flank pain.  Endocrine:     Denies hot or cold intolerance, polyuria, polydipsia. Musculoskeletal:  Denies unexplained myalgias, joint swelling, arthralgias, gait problems.  Skin:  Denies rash, suspicious lesions or new/ changes in moles Neurological:    Denies dizziness, syncope, unexplained weakness, lightheadedness, numbness  Psychiatric/Behavioral:   Denies mood changes, suicidal or homicidal ideations, hallucinations    Objective:   Blood pressure  122/72, pulse 90, height 5' 4.75" (1.645 m), weight 265 lb (120.203 kg). Body mass index is 44.42 kg/(m^2).  General: Well Developed, well nourished, and in no acute distress.  Neuro: Alert and oriented x3, extra-ocular muscles intact, sensation grossly intact.  HEENT: Normocephalic, atraumatic, pupils equal round reactive to light, neck supple, no gross masses or enlrgmnt although limited due  to body habitus Skin: no gross suspicious lesions or rashes  Cardiac:   distant but Regular rate and rhythm, no murmurs rubs or gallops.  Respiratory:   Distant Essentially clear to auscultation bilaterally. Not using accessory muscles, speaking in full sentences.  Musculoskeletal: Ambulates w/o diff, FROM * 4 ext.  Vasc: less 2 sec cap RF, warm and pink  Psych:  No HI/SI, judgement and insight good.    Impression and Recommendations:    The patient was counseled, risk factors were discussed, anticipatory guidance given. Pt was in the office today for 40+ minutes, with over 50% time spent in face to face counseling of various medical concerns and in coordination of care  Pt fasting today- labs obtained.   Extensive counseling re: pt's morbid obesity and fascitis, MMP. txmnt options and need for routine follow up.   Review of cards notes/ recent labs done.    Problem List Items Addressed This Visit      Cardiovascular and Mediastinum   HTN (hypertension) (Chronic)   Relevant Orders   CBC with Differential/Platelet   COMPLETE METABOLIC PANEL WITH GFR   Hemoglobin A1c   Lipid panel   TSH   VITAMIN D 25 Hydroxy (Vit-D Deficiency, Fractures)     Endocrine   PCOS (polycystic ovarian syndrome)   Relevant Orders   CBC with Differential/Platelet   COMPLETE METABOLIC PANEL WITH GFR   Hemoglobin A1c   Lipid panel   TSH   VITAMIN D 25 Hydroxy (Vit-D Deficiency, Fractures)     Musculoskeletal and Integument   Plantar fasciitis of left foot (Chronic)     Other   High triglycerides (Chronic)     Relevant Orders   CBC with Differential/Platelet   COMPLETE METABOLIC PANEL WITH GFR   Hemoglobin A1c   Lipid panel   TSH   VITAMIN D 25 Hydroxy (Vit-D Deficiency, Fractures)   Metabolic syndrome - Primary (Chronic)   Relevant Orders   CBC with Differential/Platelet   COMPLETE METABOLIC PANEL WITH GFR   Hemoglobin A1c   Lipid panel   TSH   VITAMIN D 25 Hydroxy (Vit-D Deficiency, Fractures)   S/P hysterectomy with oophorectomy (Chronic)   Relevant Orders   CBC with Differential/Platelet   COMPLETE METABOLIC PANEL WITH GFR   Hemoglobin A1c   Lipid panel   TSH   VITAMIN D 25 Hydroxy (Vit-D Deficiency, Fractures)   Morbid obesity (HCC) (Chronic)   Physical deconditioning (Chronic)    Other Visit Diagnoses    Obesity  (Chronic)       Relevant Orders    CBC with Differential/Platelet    COMPLETE METABOLIC PANEL WITH GFR    Hemoglobin A1c    Lipid panel    TSH    VITAMIN D 25 Hydroxy (Vit-D Deficiency, Fractures)      Meds ordered this encounter  Medications  . PREMARIN 0.625 MG tablet    Sig: Take 0.625 mg by mouth daily.    Refill:  12  . ranitidine (ZANTAC) 150 MG capsule    Sig: Take 150 mg by mouth daily.    Gross side effects, risk and benefits, and alternatives of medications discussed with patient.  Patient is aware that all medications have potential side effects and we are unable to predict every side effect or drug-drug interaction that may occur.  Expresses verbal understanding and consents to current therapy plan and treatment regimen.  Please see AVS handed out to patient at the end of our visit for further patient instructions/ counseling  done pertaining to today's office visit.   Note: This document was prepared using Dragon voice recognition software and may include unintentional dictation errors.

## 2016-03-04 LAB — CBC WITH DIFFERENTIAL/PLATELET
BASOS ABS: 66 {cells}/uL (ref 0–200)
Basophils Relative: 1 %
EOS ABS: 132 {cells}/uL (ref 15–500)
EOS PCT: 2 %
HEMATOCRIT: 43.2 % (ref 35.0–45.0)
HEMOGLOBIN: 14.5 g/dL (ref 11.7–15.5)
LYMPHS ABS: 3300 {cells}/uL (ref 850–3900)
Lymphocytes Relative: 50 %
MCH: 29.5 pg (ref 27.0–33.0)
MCHC: 33.6 g/dL (ref 32.0–36.0)
MCV: 88 fL (ref 80.0–100.0)
MONO ABS: 462 {cells}/uL (ref 200–950)
MPV: 9.9 fL (ref 7.5–12.5)
Monocytes Relative: 7 %
NEUTROS ABS: 2640 {cells}/uL (ref 1500–7800)
NEUTROS PCT: 40 %
Platelets: 251 10*3/uL (ref 140–400)
RBC: 4.91 MIL/uL (ref 3.80–5.10)
RDW: 14.9 % (ref 11.0–15.0)
WBC: 6.6 10*3/uL (ref 3.8–10.8)

## 2016-03-04 LAB — VITAMIN D 25 HYDROXY (VIT D DEFICIENCY, FRACTURES): Vit D, 25-Hydroxy: 30 ng/mL (ref 30–100)

## 2016-03-04 LAB — COMPLETE METABOLIC PANEL WITH GFR
ALBUMIN: 4.1 g/dL (ref 3.6–5.1)
ALT: 19 U/L (ref 6–29)
AST: 27 U/L (ref 10–35)
Alkaline Phosphatase: 52 U/L (ref 33–115)
BUN: 18 mg/dL (ref 7–25)
CALCIUM: 9.7 mg/dL (ref 8.6–10.2)
CHLORIDE: 102 mmol/L (ref 98–110)
CO2: 27 mmol/L (ref 20–31)
CREATININE: 0.78 mg/dL (ref 0.50–1.10)
GFR, Est African American: >89 mL/min (ref >=60)
GFR, Est Non African American: >89 mL/min (ref >=60)
GLUCOSE: 110 mg/dL — AB (ref 65–99)
Potassium: 4.8 mmol/L (ref 3.5–5.3)
SODIUM: 140 mmol/L (ref 135–146)
TOTAL PROTEIN: 6.8 g/dL (ref 6.1–8.1)
Total Bilirubin: 0.6 mg/dL (ref 0.2–1.2)

## 2016-03-04 LAB — LIPID PANEL
CHOL/HDL RATIO: 4 ratio (ref <=5.0)
Cholesterol: 205 mg/dL — ABNORMAL HIGH (ref 125–200)
HDL: 51 mg/dL (ref >=46)
LDL CALC: 103 mg/dL (ref <130)
Triglycerides: 256 mg/dL — ABNORMAL HIGH (ref <150)
VLDL: 51 mg/dL — AB (ref <30)

## 2016-03-04 LAB — TSH: TSH: 0.99 m[IU]/L

## 2016-03-04 LAB — HEMOGLOBIN A1C
Hgb A1c MFr Bld: 5.7 % — ABNORMAL HIGH (ref <5.7)
Mean Plasma Glucose: 117 mg/dL

## 2016-03-10 ENCOUNTER — Encounter: Payer: Self-pay | Admitting: Family Medicine

## 2016-03-10 ENCOUNTER — Ambulatory Visit (INDEPENDENT_AMBULATORY_CARE_PROVIDER_SITE_OTHER): Payer: BLUE CROSS/BLUE SHIELD | Admitting: Family Medicine

## 2016-03-10 VITALS — BP 117/76 | HR 80 | Temp 98.4°F | Wt 266.3 lb

## 2016-03-10 DIAGNOSIS — Z8709 Personal history of other diseases of the respiratory system: Secondary | ICD-10-CM

## 2016-03-10 DIAGNOSIS — R7302 Impaired glucose tolerance (oral): Secondary | ICD-10-CM

## 2016-03-10 DIAGNOSIS — R0600 Dyspnea, unspecified: Secondary | ICD-10-CM

## 2016-03-10 DIAGNOSIS — J3089 Other allergic rhinitis: Secondary | ICD-10-CM | POA: Diagnosis not present

## 2016-03-10 DIAGNOSIS — J0100 Acute maxillary sinusitis, unspecified: Secondary | ICD-10-CM | POA: Diagnosis not present

## 2016-03-10 DIAGNOSIS — J019 Acute sinusitis, unspecified: Secondary | ICD-10-CM | POA: Insufficient documentation

## 2016-03-10 DIAGNOSIS — E781 Pure hyperglyceridemia: Secondary | ICD-10-CM

## 2016-03-10 DIAGNOSIS — I10 Essential (primary) hypertension: Secondary | ICD-10-CM

## 2016-03-10 DIAGNOSIS — R5381 Other malaise: Secondary | ICD-10-CM

## 2016-03-10 MED ORDER — ALBUTEROL SULFATE HFA 108 (90 BASE) MCG/ACT IN AERS: 2.0000 | INHALATION_SPRAY | Freq: Four times a day (QID) | RESPIRATORY_TRACT | 11 refills | Status: DC | PRN

## 2016-03-10 MED ORDER — MONTELUKAST SODIUM 10 MG PO TABS: 10.0000 mg | ORAL_TABLET | Freq: Every day | ORAL | 3 refills | Status: DC

## 2016-03-10 NOTE — Progress Notes (Signed)
Assessment and plan:  1. Acute maxillary sinusitis, recurrence not specified   2. Environmental and seasonal allergies   3. Glucose intolerance (impaired glucose tolerance)   4. Dyspnea   5. High triglycerides   6. Hx of bronchitis   7. Essential hypertension   8. Physical deconditioning   9. Morbid obesity, unspecified obesity type ([place])      Reassured patient it is not bacterial in nature, no antibiotics warranted, physical exam reassuring  Take a Claritin daily, Nettie pot twice a day,  Prediabetes counseling done; handouts provided  Discussion on prudent diet, handouts provided  For patients feelings of shortness of breath she requested refill of albuterol which I gave her.   Blood pressure well controlled  Must exercise daily even if it's 5 minutes  Detrimental effects of her morbidity on her health discussed in detail. Likely has some restrictive process to the lungs due to her body habitus. Strongly encouraged weight loss. Phentermine discussed. Patient will try to lose 5% of her weight in the next 4 weeks- if she does we will start phentermine  Patient's Medications  New Prescriptions   ALBUTEROL (PROVENTIL HFA;VENTOLIN HFA) 108 (90 BASE) MCG/ACT INHALER    Inhale 2 puffs into the lungs every 6 (six) hours as needed for wheezing.   MONTELUKAST (SINGULAIR) 10 MG TABLET    Take 1 tablet (10 mg total) by mouth at bedtime.  Previous Medications   FENOFIBRATE 54 MG TABLET    Take 54 mg by mouth daily.   HYDROCHLOROTHIAZIDE (HYDRODIURIL) 25 MG TABLET    Take 25 mg by mouth daily.    LORATADINE (CLARITIN) 10 MG TABLET    Take 20 mg by mouth daily. TAKE 2 TABLETS BY MOUTH DAILY   PREMARIN 0.625 MG TABLET    Take 0.625 mg by mouth daily.   PROBIOTIC PRODUCT (PROBIOTIC-10 PO)    Take 1 tablet by mouth daily.   RANITIDINE (ZANTAC) 150 MG CAPSULE    Take 150 mg by mouth daily.  Modified Medications   No  medications on file  Discontinued Medications   No medications on file    Anticipatory guidance and routine counseling done re: condition, txmnt options and need for follow up. All questions of patient's were answered.   Gross side effects, risk and benefits, and alternatives of medications discussed with patient.  Patient is aware that all medications have potential side effects and we are unable to predict every sideeffect or drug-drug interaction that may occur.  Expresses verbal understanding and consents to current therapy plan and treatment regiment.  Please see AVS handed out to patient at the end of our visit for additional patient instructions/ counseling done pertaining to today's office visit.  Note: This document was prepared using Dragon voice recognition software and may include unintentional dictation errors.   ----------------------------------------------------------------------------------------------------------------------  Subjective:   CC: Nicole Cooper is a 50 y.o. female who presents to [place] [facility] today for For lab review, discuss her weight and has acute URI symptoms   Patient had a URI/ cold a month ago,  was seen in the urgent care and treated with amoxicillin and prednisone she believes.   She's been feeling okay since then but feels that that treatment did not knock it out.    It was approximately 2 days ago when she started with increasing symptoms again.  She complains of left ear pain, swollen glands in her neck and chest congestion  that just never seemed to completely go away. No productive cough but it is dry. No fever chills. Only feels short of breath or wheezy after she gets to coughing bad.  She forgot her Claritin for a week and a half in the remote past and recently just restarted it.   Tried tried over-the-counter Mucinex for this cold with no improvement    Patient morbidly obese and now today her labs are in the  prediabetic range- A1c elevated    Long history of poor cholesterol and patient used to have triglycerides of over 600 just a few months ago. Her other doctor gave her fenofibrate and it has come down significantly per patient; unfortunately still do not have those records...       Full medical history updated and reviewed in the office today  Patient Active Problem List   Diagnosis Date Noted  . Glucose intolerance (impaired glucose tolerance) 03/10/2016  . Sinusitis, acute recurrent 03/10/2016  . PCOS (polycystic ovarian syndrome) 03/03/2016  . Metabolic syndrome 99991111  . S/P hysterectomy with oophorectomy 03/03/2016  . Plantar fasciitis of left foot 03/03/2016  . Morbid obesity ([place]) 03/03/2016  . Physical deconditioning 03/03/2016  . GERD (gastroesophageal reflux disease) 03/03/2016  . Environmental and seasonal allergies 03/03/2016  . Feared condition not demonstrated 03/03/2016  . Post-nasal drip   . Hx of bronchitis   . Dental crowns present   . Fluid retention   . HTN (hypertension)   . High triglycerides   . h/o Hair loss   . History of urticaria   . Breathing problem   . Chest heaviness   . Dyspnea   . Carpal tunnel syndrome of right wrist 12/15/2012    Past Medical History:  Diagnosis Date  . Allergy   . Breathing problem   . 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 Chest heaviness   . Dental crowns present    x 2  . Dyspnea   . Fluid retention    takes HCTZ prn  . Hair loss   . High triglycerides   . History of urticaria    COUGH  . HTN (hypertension)   . Hx of bronchitis   . Obesity   . Post-nasal drip    year-round    Past Surgical History:  Procedure Laterality Date  . CARPAL TUNNEL RELEASE Left 11/23/2012   Procedure: LEFT CARPAL TUNNEL RELEASE;  Surgeon: Wynonia Sours, MD;  Location: [place];  Service: Orthopedics;  Laterality: Left;  . CARPAL TUNNEL RELEASE Right 12/21/2012   Procedure: CARPAL TUNNEL RELEASE;   Surgeon: Wynonia Sours, MD;  Location: [facility];  Service: Orthopedics;  Laterality: Right;  . CESAREAN SECTION  12/21/2003  . DILATION AND CURETTAGE OF UTERUS  2003   x 2  . LAPAROSCOPIC ASSISTED VAGINAL HYSTERECTOMY  10/25/2007  . OVARIAN CYST REMOVAL     age 46  . POLYPS REMOVED FROM UTERINE    . SALPINGOOPHORECTOMY  2010  . VAGINAL HYSTERECTOMY    . WISDOM TOOTH EXTRACTION     as a teenager    Social History  Substance Use Topics  . Smoking status: Never Smoker  . Smokeless tobacco: Never Used  . Alcohol use No    family history includes Cancer in her maternal grandmother and mother; Cancer - Lung in her mother; Hyperlipidemia in her brother and father; Hypertension in her mother.   Medications: Current Outpatient Prescriptions  Medication Sig Dispense Refill  .  fenofibrate 54 MG tablet Take 54 mg by mouth daily.  2  . hydrochlorothiazide (HYDRODIURIL) 25 MG tablet Take 25 mg by mouth daily.     Marland Kitchen loratadine (CLARITIN) 10 MG tablet Take 20 mg by mouth daily. TAKE 2 TABLETS BY MOUTH DAILY  3  . PREMARIN 0.625 MG tablet Take 0.625 mg by mouth daily.  12  . Probiotic Product (PROBIOTIC-10 PO) Take 1 tablet by mouth daily.    . ranitidine (ZANTAC) 150 MG capsule Take 150 mg by mouth daily.    Marland Kitchen albuterol (PROVENTIL HFA;VENTOLIN HFA) 108 (90 Base) MCG/ACT inhaler Inhale 2 puffs into the lungs every 6 (six) hours as needed for wheezing. 2 Inhaler 11  . montelukast (SINGULAIR) 10 MG tablet Take 1 tablet (10 mg total) by mouth at bedtime. 90 tablet 3   No current facility-administered medications for this visit.     Allergies:  Allergies  Allergen Reactions  . Keflex [Cephalexin] Itching and Other (See Comments)    Yeast infection  . Percocet [Oxycodone-Acetaminophen] Itching  . Sulfa Antibiotics Rash     ROS:  Const:    no fevers, chills Eyes:    conjunctiva clear, no vision changes or blurred vision ENT:  no hearing difficulties, no dysphagia, no  dysphonia, no nose bleeds CV:   no chest pain, arrhythmias, no orthopnea, no PND Pulm:   no SOB at rest or exertion, no Wheeze, no DIB, no hemoptysis GI:    no N/V/D/C, no abd pain GU:   no blood in urine or inc freq or urgency Heme/Onc:    no unexplained bleeding, no night sweats, no more fatigue than usual Neuro:   No dizziness, no LOC, No unexplained weakness or numbness Endo:   no unexplained wt loss or gain M-Sk:   no localized myalgias or arthralgias Psych:    No SI/HI, no memory prob or unexplained confusion    Objective:  Exam:  BP 117/76 (BP Location: Right Arm, Patient Position: Sitting, Cuff Size: Large)   Pulse 80   Temp 98.4 F (36.9 C) (Oral)   Wt 266 lb 4.8 oz (120.8 kg)   BMI 44.66 kg/m  Body mass index is 44.66 kg/m. Gen: Well NAD, A and O *3 HEENT: /AT, EOMI,  MMM, OP- clr, TMs bilaterally are within normal limits. Nares mild erythema and edema. Oropharynx clear. No lymphadenopathy. Lungs: Normal work of breathing. CTA B/L, And anteriorly as well as posteriorly., no Wh, rhonchi Heart: RRR, S1, S2 WNL's, no MRG Abd: Soft. No gross distention Exts: warm, pink,  Brisk capillary refill, warm and well perfused.

## 2016-03-10 NOTE — Patient Instructions (Addendum)
Vitamin D is borderline low. Take 5000 IUs of vitamin D3 daily we will recheck this in 6 months   Everything you put in your mouth track on losing it even a little cookie or candy.  Your goal is to lose 5% of your weight in 4 weeks to be considered for phentermine treatment that would be 13.3 pounds  Return to clinic in 4 weeks for weight recheck and consideration of phentermine  Consider joining Weight Wat[employer] and going to meetings at least weekly   Obesity Obesity is defined as having too much total body fat and a body mass index (BMI) of 30 or more. BMI is an estimate of body fat and is calculated from your height and weight. BMI is typically calculated by your health care provider during regular wellness visits. Obesity happens when you consume more calories than you can burn by exercising or performing daily physical tasks. Prolonged obesity can cause major illnesses or emergencies, such as:  Stroke.  Heart disease.  Diabetes.  Cancer.  Arthritis.  High blood pressure (hypertension).  High cholesterol.  Sleep apnea.  Erectile dysfunction.  Infertility problems. CAUSES   Regularly eating unhealthy foods.  Physical inactivity.  Certain disorders, such as an underactive thyroid (hypothyroidism), Cushing's syndrome, and polycystic ovarian syndrome.  Certain medicines, such as steroids, some depression medicines, and antipsychotics.  Genetics.  Lack of sleep. DIAGNOSIS A health care provider can diagnose obesity after calculating your BMI. Obesity will be diagnosed if your BMI is 30 or higher. There are other methods of measuring obesity levels. Some other methods include measuring your skinfold thickness, your waist circumference, and comparing your hip circumference to your waist circumference. TREATMENT  A healthy treatment program includes some or all of the following:  Long-term dietary changes.  Exercise and physical activity.  Behavioral and lifestyle  changes.  Medicine only under the supervision of your health care provider. Medicines may help, but only if they are used with diet and exercise programs. If your BMI is 40 or higher, your health care provider may recommend specialized surgery or programs to help with weight loss. An unhealthy treatment program includes:  Fasting.  Fad diets.  Supplements and drugs. These choices do not succeed in long-term weight control. HOME CARE INSTRUCTIONS  Exercise and perform physical activity as directed by your health care provider. To increase physical activity, try the following:  Use stairs instead of elevators.  Park farther away from store entrances.  Garden, bike, or walk instead of watching television or using the computer.  Eat healthy, low-calorie foods and drinks on a regular basis. Eat more fruits and vegetables. Use low-calorie cookbooks or take healthy cooking classes.  Limit fast food, sweets, and processed snack foods.  Eat smaller portions.  Keep a daily journal of everything you eat. There are many free websites to help you with this. It may be helpful to measure your foods so you can determine if you are eating the correct portion sizes.  Avoid drinking alcohol. Drink more water and drinks without calories.  Take vitamins and supplements only as recommended by your health care provider.  Weight-loss support groups, Tax adviser, counselors, and stress reduction education can also be very helpful. SEEK IMMEDIATE MEDICAL CARE IF:  You have chest pain or tightness.  You have trouble breathing or feel short of breath.  You have weakness or leg numbness.  You feel confused or have trouble talking.  You have sudden changes in your vision.   This information is  not intended to replace advice given to you by your health care provider. Make sure you discuss any questions you have with your health care provider.   Document Released: 09/10/2004 Document  Revised: 08/24/2014 Document Reviewed: 09/09/2011 Elsevier Interactive Patient Education [employer].

## 2016-03-11 ENCOUNTER — Ambulatory Visit: Payer: BLUE CROSS/BLUE SHIELD | Admitting: Family Medicine

## 2016-03-24 DIAGNOSIS — M722 Plantar fascial fibromatosis: Secondary | ICD-10-CM | POA: Diagnosis not present

## 2016-04-14 ENCOUNTER — Ambulatory Visit: Payer: BLUE CROSS/BLUE SHIELD | Admitting: Family Medicine

## 2016-10-31 DIAGNOSIS — M722 Plantar fascial fibromatosis: Secondary | ICD-10-CM | POA: Diagnosis not present

## 2016-11-24 DIAGNOSIS — R05 Cough: Secondary | ICD-10-CM | POA: Diagnosis not present

## 2016-11-24 DIAGNOSIS — R062 Wheezing: Secondary | ICD-10-CM | POA: Diagnosis not present

## 2016-11-25 ENCOUNTER — Ambulatory Visit: Payer: BLUE CROSS/BLUE SHIELD | Admitting: Adult Health

## 2016-11-30 DIAGNOSIS — J209 Acute bronchitis, unspecified: Secondary | ICD-10-CM | POA: Diagnosis not present

## 2016-11-30 DIAGNOSIS — R05 Cough: Secondary | ICD-10-CM | POA: Diagnosis not present

## 2016-12-29 DIAGNOSIS — L820 Inflamed seborrheic keratosis: Secondary | ICD-10-CM | POA: Diagnosis not present

## 2016-12-31 DIAGNOSIS — M722 Plantar fascial fibromatosis: Secondary | ICD-10-CM | POA: Diagnosis not present

## 2017-02-23 DIAGNOSIS — L820 Inflamed seborrheic keratosis: Secondary | ICD-10-CM | POA: Diagnosis not present

## 2017-03-04 DIAGNOSIS — H04123 Dry eye syndrome of bilateral lacrimal glands: Secondary | ICD-10-CM | POA: Diagnosis not present

## 2017-03-13 ENCOUNTER — Other Ambulatory Visit: Payer: Self-pay | Admitting: Family Medicine

## 2017-03-16 ENCOUNTER — Ambulatory Visit (INDEPENDENT_AMBULATORY_CARE_PROVIDER_SITE_OTHER): Payer: BLUE CROSS/BLUE SHIELD | Admitting: Family Medicine

## 2017-03-16 ENCOUNTER — Encounter: Payer: Self-pay | Admitting: Family Medicine

## 2017-03-16 VITALS — BP 126/83 | HR 76 | Ht 64.75 in | Wt 257.9 lb

## 2017-03-16 DIAGNOSIS — E8881 Metabolic syndrome: Secondary | ICD-10-CM

## 2017-03-16 DIAGNOSIS — R069 Unspecified abnormalities of breathing: Secondary | ICD-10-CM

## 2017-03-16 DIAGNOSIS — F4320 Adjustment disorder, unspecified: Secondary | ICD-10-CM | POA: Diagnosis not present

## 2017-03-16 DIAGNOSIS — I10 Essential (primary) hypertension: Secondary | ICD-10-CM

## 2017-03-16 DIAGNOSIS — R7302 Impaired glucose tolerance (oral): Secondary | ICD-10-CM

## 2017-03-16 DIAGNOSIS — R4589 Other symptoms and signs involving emotional state: Secondary | ICD-10-CM

## 2017-03-16 DIAGNOSIS — E781 Pure hyperglyceridemia: Secondary | ICD-10-CM | POA: Diagnosis not present

## 2017-03-16 DIAGNOSIS — E559 Vitamin D deficiency, unspecified: Secondary | ICD-10-CM

## 2017-03-16 DIAGNOSIS — R5381 Other malaise: Secondary | ICD-10-CM | POA: Diagnosis not present

## 2017-03-16 MED ORDER — FENOFIBRATE 54 MG PO TABS: 54.0000 mg | ORAL_TABLET | Freq: Every day | ORAL | 1 refills | Status: DC

## 2017-03-16 MED ORDER — HYDROCHLOROTHIAZIDE 25 MG PO TABS: 25.0000 mg | ORAL_TABLET | Freq: Every day | ORAL | 1 refills | Status: DC

## 2017-03-16 MED ORDER — MONTELUKAST SODIUM 10 MG PO TABS
10.0000 mg | ORAL_TABLET | Freq: Every day | ORAL | 1 refills | Status: DC
Start: 2017-03-16 — End: 2017-11-22

## 2017-03-16 NOTE — Patient Instructions (Addendum)
Thank you for your time and patience today.  I hope you found your office visit with me encouraging and educational, as my goal is to help my patients become healthier and happier.    Please note that you may receive a survey via email or snail mail about your experience today.  Please feel free to be open and honest about your experiences with [place], as your input is valuable to [place] and will help [place] to improve our care and services.  Otherwise, please feel free to do a google review on [place] or go to [email or web address] and click on the FACEBOOK link to leave your thoughts.  Thank you for your input and in allowing [place] to care for you today!    -  Dr Raliegh Scarlet   GOALS: 1)  3  24oz cups of water per day 2)  10 min + 53min walking daily--ice water bottle btm of feet 2-3 times per day   Please realize, EXERCISE IS MEDICINE!  -  American Heart Association [facility]) guidelines for exercise : If you are in good health, without any medical conditions, you should engage in 150 minutes of moderate intensity aerobic activity per week.  This means you should be huffing and puffing throughout your workout.   Engaging in regular exercise will improve brain function and memory, as well as improve mood, boost immune system and help with weight management.  As well as the other, more well-known effects of exercise such as decreasing blood sugar levels, decreasing blood pressure,  and decreasing bad cholesterol levels/ increasing good cholesterol levels.     -  The AHA strongly endorses consumption of a diet that contains a variety of foods from all the food categories with an emphasis on fruits and vegetables; fat-free and low-fat dairy products; cereal and grain products; legumes and nuts; and fish, poultry, and/or extra lean meats.    Excessive food intake, especially of foods high in saturated and trans fats, sugar, and salt, should be avoided.    Adequate water intake of  roughly 1/2 of your weight in pounds, should equal the ounces of water per day you should drink.  So for instance, if you're 200 pounds, that would be 100 ounces of water per day.      Let me know if you would like that referral for the registered dietitian for help in finding 1.     Behavioral Health/Psychiatry Referrals  Wilber Oliphant, MSW [address] [place]   [facility] Apolonio Schneiders, PhD [address], [facility] [place] [phone]  [facility] [address], [place], [place] [phone]  Lanelle Bal [facility] and [employer] [place] abuse [facility] Manager [address], [place] [phone] [place] [phone] [place] Delmer Islam, PhD Oneida Arenas, PhD Leitha Bleak, LCSW Jillene Bucks, PhD-child, adolescent and adults  [facility] [address], [place] [phone] Merilyn Baba, MS-child, adolescent and adults Lennart Pall, PhD-adolescent and adults  [facility], terminal illness [place], [place] [address], [facility] [place] [facility] [address] [place] [phone]  [facility] [place], [place]  [facility] [address] [place], [place]  [facility] [address], [place] [phone]  [facility] [address], [place]  [facility] [address], [place] [address], [place]

## 2017-03-16 NOTE — Progress Notes (Signed)
Impression and Recommendations:    1. Obesity, Class III, BMI 40-49.9 (morbid obesity) ([place])   2. Metabolic syndrome   3. Essential hypertension   4. High triglycerides   5. Glucose intolerance (impaired glucose tolerance)   6. Breathing problem   7. Vitamin D insufficiency   8. Emotional upset- due to her wt   9. Physical deconditioning    - Extensive conversation regarding weight loss and emotional habits associated with overeating, lack of thinking that you're worthy, possibly coming to a life coach\counselor, also discussed going to a weight-loss bariatric medicine clinic, Weight Watchers, keto diet, and others.  We discussed how patient is not working full-time and she has itching every day she could be out exercising now.  We discussed barriers that may exist which inhibit her from reaching goals.  I highly suggest that she go to a counselor to discuss these emotions tied in with her obesity and gave her a list.  After much discussion patient decided that she wanted to come back in 4-6 weeks and decided to create some very discrete goals with me that she can achieve. -See AVS for details.   - Patient is fasting today and labs were obtained, please see orders for details.    - Multiple medical conditions assessed above were stable and med refills were provided  Pt was in the office today for 40+ minutes, with over 50% time spent in face to face counseling of patients various medical conditions, treatment plans of those medical conditions including medicine management and lifestyle modification, strategies to improve health and well being; and in coordination of care. SEE ABOVE FOR DETAILS  No problem-specific Assessment & Plan notes found for this encounter.   The patient was counseled, risk factors were discussed, anticipatory guidance given.   New Prescriptions   No medications on file    Meds ordered this encounter  Medications  . hydrochlorothiazide (HYDRODIURIL)  25 MG tablet    Sig: Take 1 tablet (25 mg total) by mouth daily.    Dispense:  90 tablet    Refill:  1  . montelukast (SINGULAIR) 10 MG tablet    Sig: Take 1 tablet (10 mg total) by mouth at bedtime.    Dispense:  90 tablet    Refill:  1  . fenofibrate 54 MG tablet    Sig: Take 1 tablet (54 mg total) by mouth daily.    Dispense:  90 tablet    Refill:  1     Modified Medications   Modified Medication Previous Medication   FENOFIBRATE 54 MG TABLET fenofibrate 54 MG tablet      Take 1 tablet (54 mg total) by mouth daily.    Take 54 mg by mouth daily.   HYDROCHLOROTHIAZIDE (HYDRODIURIL) 25 MG TABLET hydrochlorothiazide (HYDRODIURIL) 25 MG tablet      Take 1 tablet (25 mg total) by mouth daily.    Take 25 mg by mouth daily.    MONTELUKAST (SINGULAIR) 10 MG TABLET montelukast (SINGULAIR) 10 MG tablet      Take 1 tablet (10 mg total) by mouth at bedtime.    TAKE 1 TABLET (10 MG TOTAL) BY MOUTH AT BEDTIME.      Orders Placed This Encounter  Procedures  . CBC with Differential/Platelet  . Comprehensive metabolic panel  . HIV antibody  . Lipid panel  . Hemoglobin A1c  . TSH  . VITAMIN D 25 Hydroxy (Vit-D Deficiency, Fractures)  . T4, free  Gross side effects, risk and benefits, and alternatives of medications and treatment plan in general discussed with patient.  Patient is aware that all medications have potential side effects and we are unable to predict every side effect or drug-drug interaction that may occur.   Patient will call with any questions prior to using medication if they have concerns.  Expresses verbal understanding and consents to current therapy and treatment regimen.  No barriers to understanding were identified.  Red flag symptoms and signs discussed in detail.  Patient expressed understanding regarding what to do in case of emergency\urgent symptoms  Please see AVS handed out to patient at the end of our visit for further patient instructions/ counseling done  pertaining to today's office visit.   Return for 4-6 wks f/up re: health GOALS- min 75oz water intake and walking 80min QD.     Note: This document was prepared using Dragon voice recognition software and may include unintentional dictation errors.  Mellody Dance 2:46 PM --------------------------------------------------------------------------------------------------------------------------------------------------------------------------------------------------------------------------------------------    Subjective:    CC:  Chief Complaint  Patient presents with  . Follow-up    HPI: Nicole Cooper is a 51 y.o. female who presents to [place] [facility] today for issues as discussed below.  Last office visit I started patient on Singulair for her breathing-  Has been much improved and no illnesses/ bronchitis since she has been on it--> which is much better than usual.  No wheeze. no inhaler use excpt for if dev chest cold.   Wt- has lost almost 10 lbs since 1 yr ago-.  Pt doing keto now.  Still struggles/ has hard time with her wt at times.  When her husband goes out of town she tends to not be so healthy.  NOt sure why emotionally eating at times.  Feels she is doing all she can do and feels exhausted / overwhelmed about it.    fenofibrate for her triglycerides.  Tolerated well - no issues.   Vit D3 insufficiency--> 5k iu per day.  Patient feeling much better since starting the medicine.  Problem  Obesity, Class III, Bmi 40-49.9 (Morbid Obesity) ([facility])  Htn (Hypertension)  High Triglycerides  Glucose Intolerance (Impaired Glucose Tolerance)  S/P Hysterectomy With Oophorectomy  Gerd (Gastroesophageal Reflux Disease)  Vitamin D Insufficiency  Emotional upset- due to her wt  Metabolic Syndrome   Per pt- BS always good; just really high TG- in her teens- in the 900's.  Has insulin resistance and tough losing wt per pt   Plantar Fasciitis of Left Foot   Physical Deconditioning  Environmental and Seasonal Allergies     Wt Readings from Last 3 Encounters:  03/16/17 257 lb 14.4 oz (117 kg)  03/10/16 266 lb 4.8 oz (120.8 kg)  03/03/16 265 lb (120.2 kg)   BP Readings from Last 3 Encounters:  03/16/17 126/83  03/10/16 117/76  03/03/16 122/72   Pulse Readings from Last 3 Encounters:  03/16/17 76  03/10/16 80  03/03/16 90   BMI Readings from Last 3 Encounters:  03/16/17 43.25 kg/m  03/10/16 44.66 kg/m  03/03/16 44.44 kg/m     Patient Care Team    Relationship Specialty Notifications Start End  Mellody Dance, DO PCP - General Family Medicine  03/02/16      Patient Active Problem List   Diagnosis Date Noted  . Obesity, Class III, BMI 40-49.9 (morbid obesity) ([place]) 03/03/2016    Priority: High  . HTN (hypertension)  Priority: High  . High triglycerides     Priority: High  . Glucose intolerance (impaired glucose tolerance) 03/10/2016    Priority: Medium  . S/P hysterectomy with oophorectomy 03/03/2016    Priority: Medium  . GERD (gastroesophageal reflux disease) 03/03/2016    Priority: Medium  . Vitamin D insufficiency 03/16/2017    Priority: Low  . Emotional upset- due to her wt 03/16/2017    Priority: Low  . Metabolic syndrome 16/05/9603    Priority: Low  . Plantar fasciitis of left foot 03/03/2016    Priority: Low  . Physical deconditioning 03/03/2016    Priority: Low  . Environmental and seasonal allergies 03/03/2016    Priority: Low  . Sinusitis, acute recurrent 03/10/2016  . Feared condition not demonstrated 03/03/2016  . Post-nasal drip   . Hx of bronchitis   . Dental crowns present   . Fluid retention   . h/o Hair loss   . History of urticaria   . Breathing problem   . Chest heaviness   . Dyspnea   . Carpal tunnel syndrome of right wrist 12/15/2012    Past Medical history, Surgical history, Family history, Social history, Allergies and Medications have been entered into the medical  record, reviewed and changed as needed.    Current Meds  Medication Sig  . albuterol (PROVENTIL HFA;VENTOLIN HFA) 108 (90 Base) MCG/ACT inhaler Inhale 2 puffs into the lungs every 6 (six) hours as needed for wheezing.  . fenofibrate 54 MG tablet Take 1 tablet (54 mg total) by mouth daily.  . hydrochlorothiazide (HYDRODIURIL) 25 MG tablet Take 1 tablet (25 mg total) by mouth daily.  Marland Kitchen loratadine (CLARITIN) 10 MG tablet Take 20 mg by mouth daily. TAKE 2 TABLETS BY MOUTH DAILY  . montelukast (SINGULAIR) 10 MG tablet Take 1 tablet (10 mg total) by mouth at bedtime.  Marland Kitchen PREMARIN 0.625 MG tablet Take 0.625 mg by mouth daily.  . Probiotic Product (PROBIOTIC-10 PO) Take 1 tablet by mouth daily.  . ranitidine (ZANTAC) 150 MG capsule Take 150 mg by mouth daily.  . [DISCONTINUED] fenofibrate 54 MG tablet Take 54 mg by mouth daily.  . [DISCONTINUED] hydrochlorothiazide (HYDRODIURIL) 25 MG tablet Take 25 mg by mouth daily.   . [DISCONTINUED] montelukast (SINGULAIR) 10 MG tablet TAKE 1 TABLET (10 MG TOTAL) BY MOUTH AT BEDTIME.    Allergies:  Allergies  Allergen Reactions  . Keflex [Cephalexin] Itching and Other (See Comments)    Yeast infection  . Percocet [Oxycodone-Acetaminophen] Itching  . Sulfa Antibiotics Rash     Review of Systems: General:   Denies fever, chills, unexplained weight loss.  Optho/Auditory:   Denies visual changes, blurred vision/LOV Respiratory:   Denies wheeze, DOE more than baseline levels.  Cardiovascular:   Denies chest pain, palpitations, new onset peripheral edema  Gastrointestinal:   Denies nausea, vomiting, diarrhea, abd pain.  Genitourinary: Denies dysuria, freq/ urgency, flank pain or discharge from genitals.  Endocrine:     Denies hot or cold intolerance, polyuria, polydipsia. Musculoskeletal:   Denies unexplained myalgias, joint swelling, unexplained arthralgias, gait problems.  Skin:  Denies new onset rash, suspicious lesions Neurological:     Denies  dizziness, unexplained weakness, numbness  Psychiatric/Behavioral:   Denies mood changes, suicidal or homicidal ideations, hallucinations    Objective:   Blood pressure 126/83, pulse 76, height 5' 4.75" (1.645 m), weight 257 lb 14.4 oz (117 kg). Body mass index is 43.25 kg/m. General:  Well Developed, well nourished, appropriate for stated age.  Neuro:  Alert and oriented,  extra-ocular muscles intact  HEENT:  Normocephalic, atraumatic, neck supple, no carotid bruits appreciated  Skin:  no gross rash, warm, pink. Cardiac:  RRR, S1 S2 Respiratory:  ECTA B/L and A/P, Not using accessory muscles, speaking in full sentences- unlabored. Vascular:  Ext warm, no cyanosis apprec.; cap RF less 2 sec. Psych:  No HI/SI, judgement and insight good, Euthymic mood. Sad affect, tearful in office.

## 2017-03-17 LAB — CBC WITH DIFFERENTIAL/PLATELET
BASOS: 0 %
Basophils Absolute: 0 10*3/uL (ref 0.0–0.2)
EOS (ABSOLUTE): 0.1 10*3/uL (ref 0.0–0.4)
Eos: 1 %
Hematocrit: 43 % (ref 34.0–46.6)
Hemoglobin: 14.6 g/dL (ref 11.1–15.9)
IMMATURE GRANS (ABS): 0 10*3/uL (ref 0.0–0.1)
IMMATURE GRANULOCYTES: 0 %
LYMPHS: 39 %
Lymphocytes Absolute: 3.1 10*3/uL (ref 0.7–3.1)
MCH: 29.4 pg (ref 26.6–33.0)
MCHC: 34 g/dL (ref 31.5–35.7)
MCV: 87 fL (ref 79–97)
MONOCYTES: 4 %
Monocytes Absolute: 0.3 10*3/uL (ref 0.1–0.9)
Neutrophils Absolute: 4.3 10*3/uL (ref 1.4–7.0)
Neutrophils: 56 %
PLATELETS: 288 10*3/uL (ref 150–379)
RBC: 4.97 x10E6/uL (ref 3.77–5.28)
RDW: 14.5 % (ref 12.3–15.4)
WBC: 7.8 10*3/uL (ref 3.4–10.8)

## 2017-03-17 LAB — T4, FREE: Free T4: 1.18 ng/dL (ref 0.82–1.77)

## 2017-03-17 LAB — LIPID PANEL
CHOL/HDL RATIO: 3.6 ratio (ref 0.0–4.4)
Cholesterol, Total: 193 mg/dL (ref 100–199)
HDL: 53 mg/dL (ref >39)
LDL CALC: 110 mg/dL — AB (ref 0–99)
TRIGLYCERIDES: 149 mg/dL (ref 0–149)
VLDL Cholesterol Cal: 30 mg/dL (ref 5–40)

## 2017-03-17 LAB — VITAMIN D 25 HYDROXY (VIT D DEFICIENCY, FRACTURES): VIT D 25 HYDROXY: 39.6 ng/mL (ref 30.0–100.0)

## 2017-03-17 LAB — COMPREHENSIVE METABOLIC PANEL
ALT: 16 IU/L (ref 0–32)
AST: 27 IU/L (ref 0–40)
Albumin/Globulin Ratio: 1.6 (ref 1.2–2.2)
Albumin: 4.6 g/dL (ref 3.5–5.5)
Alkaline Phosphatase: 68 IU/L (ref 39–117)
BILIRUBIN TOTAL: 0.4 mg/dL (ref 0.0–1.2)
BUN / CREAT RATIO: 22 (ref 9–23)
BUN: 16 mg/dL (ref 6–24)
CHLORIDE: 100 mmol/L (ref 96–106)
CO2: 22 mmol/L (ref 20–29)
Calcium: 9.9 mg/dL (ref 8.7–10.2)
Creatinine, Ser: 0.72 mg/dL (ref 0.57–1.00)
GFR, EST AFRICAN AMERICAN: 114 mL/min/{1.73_m2} (ref >59)
GFR, EST NON AFRICAN AMERICAN: 99 mL/min/{1.73_m2} (ref >59)
GLUCOSE: 90 mg/dL (ref 65–99)
Globulin, Total: 2.9 g/dL (ref 1.5–4.5)
POTASSIUM: 3.9 mmol/L (ref 3.5–5.2)
Sodium: 140 mmol/L (ref 134–144)
TOTAL PROTEIN: 7.5 g/dL (ref 6.0–8.5)

## 2017-03-17 LAB — TSH: TSH: 1.11 u[IU]/mL (ref 0.450–4.500)

## 2017-03-17 LAB — HIV ANTIBODY (ROUTINE TESTING W REFLEX): HIV Screen 4th Generation wRfx: NONREACTIVE

## 2017-03-17 LAB — HEMOGLOBIN A1C
ESTIMATED AVERAGE GLUCOSE: 114 mg/dL
Hgb A1c MFr Bld: 5.6 % (ref 4.8–5.6)

## 2017-04-15 DIAGNOSIS — Z6841 Body Mass Index (BMI) 40.0 and over, adult: Secondary | ICD-10-CM | POA: Diagnosis not present

## 2017-04-15 DIAGNOSIS — Z01419 Encounter for gynecological examination (general) (routine) without abnormal findings: Secondary | ICD-10-CM | POA: Diagnosis not present

## 2017-04-15 DIAGNOSIS — N760 Acute vaginitis: Secondary | ICD-10-CM | POA: Diagnosis not present

## 2017-05-04 ENCOUNTER — Ambulatory Visit: Payer: BLUE CROSS/BLUE SHIELD | Admitting: Family Medicine

## 2017-05-04 ENCOUNTER — Telehealth: Payer: Self-pay | Admitting: Family Medicine

## 2017-05-04 NOTE — Telephone Encounter (Signed)
Patient cld states she called pharmacy for Rx refill, they states refill request denied call provider--Pt has req'd Fenofibrate 54 MG (take 1 daily) refill. --glh

## 2017-05-04 NOTE — Telephone Encounter (Signed)
Pt informed that an RX was sent to [facility] [place], [place] on 03/16/17 for 90 day supply with 1 additional refill.  Advised pt to call pharmacy back and give them this information.  Charyl Bigger, CMA

## 2017-10-16 DIAGNOSIS — J205 Acute bronchitis due to respiratory syncytial virus: Secondary | ICD-10-CM | POA: Diagnosis not present

## 2017-10-24 ENCOUNTER — Other Ambulatory Visit: Payer: Self-pay | Admitting: Family Medicine

## 2017-10-24 DIAGNOSIS — J069 Acute upper respiratory infection, unspecified: Secondary | ICD-10-CM | POA: Diagnosis not present

## 2017-10-24 DIAGNOSIS — J45909 Unspecified asthma, uncomplicated: Secondary | ICD-10-CM | POA: Diagnosis not present

## 2017-10-24 DIAGNOSIS — J218 Acute bronchiolitis due to other specified organisms: Secondary | ICD-10-CM | POA: Diagnosis not present

## 2017-10-30 ENCOUNTER — Other Ambulatory Visit: Payer: Self-pay | Admitting: Family Medicine

## 2017-11-02 ENCOUNTER — Telehealth: Payer: Self-pay | Admitting: Family Medicine

## 2017-11-02 NOTE — Telephone Encounter (Signed)
Set patient up for provider required OV--patient request provider give her a call to explain why she needs to come in more than (once a year) says other provider gv he a years worth of refills-- pt states doesn't have the $$ to keep coming in office and shelling out the $30 copay. ---forwarding message to medical assistant.  --Dion Body

## 2017-11-03 NOTE — Telephone Encounter (Signed)
Called and spoke to patient and notified that by office policy and per Dr. Raliegh Scarlet she needs 6 months follow up to monitor HTN and cholesterol. MPulliam, CMA/RT(R)

## 2017-11-18 ENCOUNTER — Other Ambulatory Visit: Payer: Self-pay | Admitting: Family Medicine

## 2017-11-24 ENCOUNTER — Ambulatory Visit: Payer: BLUE CROSS/BLUE SHIELD | Admitting: Family Medicine

## 2018-02-22 ENCOUNTER — Other Ambulatory Visit: Payer: Self-pay | Admitting: Obstetrics and Gynecology

## 2018-02-22 DIAGNOSIS — Z1231 Encounter for screening mammogram for malignant neoplasm of breast: Secondary | ICD-10-CM

## 2018-02-25 DIAGNOSIS — N760 Acute vaginitis: Secondary | ICD-10-CM | POA: Diagnosis not present

## 2018-03-20 ENCOUNTER — Other Ambulatory Visit: Payer: Self-pay | Admitting: Family Medicine

## 2018-03-24 ENCOUNTER — Ambulatory Visit
Admission: RE | Admit: 2018-03-24 | Discharge: 2018-03-24 | Disposition: A | Discharge status: Home / Self Care | Payer: BLUE CROSS/BLUE SHIELD | Source: Ambulatory Visit | Attending: Obstetrics and Gynecology | Admitting: Obstetrics and Gynecology

## 2018-03-24 DIAGNOSIS — Z1231 Encounter for screening mammogram for malignant neoplasm of breast: Secondary | ICD-10-CM | POA: Diagnosis not present

## 2018-04-05 ENCOUNTER — Other Ambulatory Visit: Payer: Self-pay | Admitting: Family Medicine

## 2018-04-19 DIAGNOSIS — J309 Allergic rhinitis, unspecified: Secondary | ICD-10-CM | POA: Diagnosis not present

## 2018-04-19 DIAGNOSIS — J019 Acute sinusitis, unspecified: Secondary | ICD-10-CM | POA: Diagnosis not present

## 2018-04-19 DIAGNOSIS — R7309 Other abnormal glucose: Secondary | ICD-10-CM | POA: Diagnosis not present

## 2018-05-19 DIAGNOSIS — Z6841 Body Mass Index (BMI) 40.0 and over, adult: Secondary | ICD-10-CM | POA: Diagnosis not present

## 2018-05-19 DIAGNOSIS — Z01419 Encounter for gynecological examination (general) (routine) without abnormal findings: Secondary | ICD-10-CM | POA: Diagnosis not present

## 2018-05-19 LAB — HM PAP SMEAR

## 2018-10-12 ENCOUNTER — Encounter: Payer: Self-pay | Admitting: Family Medicine

## 2018-10-12 ENCOUNTER — Ambulatory Visit: Payer: BLUE CROSS/BLUE SHIELD | Admitting: Family Medicine

## 2018-10-12 VITALS — BP 123/87 | HR 87 | Temp 98.2°F | Ht 64.5 in | Wt 259.0 lb

## 2018-10-12 DIAGNOSIS — E781 Pure hyperglyceridemia: Secondary | ICD-10-CM | POA: Diagnosis not present

## 2018-10-12 DIAGNOSIS — I10 Essential (primary) hypertension: Secondary | ICD-10-CM | POA: Diagnosis not present

## 2018-10-12 DIAGNOSIS — K219 Gastro-esophageal reflux disease without esophagitis: Secondary | ICD-10-CM | POA: Diagnosis not present

## 2018-10-12 DIAGNOSIS — R7302 Impaired glucose tolerance (oral): Secondary | ICD-10-CM | POA: Diagnosis not present

## 2018-10-12 DIAGNOSIS — E8881 Metabolic syndrome: Secondary | ICD-10-CM

## 2018-10-12 DIAGNOSIS — E559 Vitamin D deficiency, unspecified: Secondary | ICD-10-CM | POA: Diagnosis not present

## 2018-10-12 DIAGNOSIS — Z0000 Encounter for general adult medical examination without abnormal findings: Secondary | ICD-10-CM

## 2018-10-12 MED ORDER — FENOFIBRATE 54 MG PO TABS: 54.0000 mg | ORAL_TABLET | Freq: Every day | ORAL | 0 refills | Status: DC

## 2018-10-12 MED ORDER — MONTELUKAST SODIUM 10 MG PO TABS: 10.0000 mg | ORAL_TABLET | Freq: Every day | ORAL | 0 refills | Status: DC

## 2018-10-12 MED ORDER — FEXOFENADINE HCL 180 MG PO TABS: 180.0000 mg | ORAL_TABLET | Freq: Every day | ORAL | 1 refills | Status: DC

## 2018-10-12 MED ORDER — ALBUTEROL SULFATE HFA 108 (90 BASE) MCG/ACT IN AERS: 2.0000 | INHALATION_SPRAY | Freq: Four times a day (QID) | RESPIRATORY_TRACT | 0 refills | Status: AC | PRN

## 2018-10-12 MED ORDER — HYDROCHLOROTHIAZIDE 25 MG PO TABS: ORAL_TABLET | ORAL | 0 refills | Status: DC

## 2018-10-12 MED ORDER — OMEPRAZOLE 20 MG PO CPDR: 20.0000 mg | DELAYED_RELEASE_CAPSULE | Freq: Every day | ORAL | 0 refills | Status: DC

## 2018-10-12 MED ORDER — FLUTICASONE PROPIONATE 50 MCG/ACT NA SUSP: NASAL | 2 refills | Status: DC

## 2018-10-12 NOTE — Patient Instructions (Signed)
Food Choices for Gastroesophageal Reflux Disease, Adult  When you have gastroesophageal reflux disease (GERD), the foods you eat and your eating habits are very important. Choosing the right foods can help ease the discomfort of GERD. Consider working with a diet and nutrition specialist (dietitian) to help you make healthy food choices.  What general guidelines should I follow?    Eating plan  · Choose healthy foods low in fat, such as fruits, vegetables, whole grains, low-fat dairy products, and lean meat, fish, and poultry.  · Eat frequent, small meals instead of three large meals each day. Eat your meals slowly, in a relaxed setting. Avoid bending over or lying down until 2-3 hours after eating.  · Limit high-fat foods such as fatty meats or fried foods.  · Limit your intake of oils, butter, and shortening to less than 8 teaspoons each day.  · Avoid the following:  ? Foods that cause symptoms. These may be different for different people. Keep a food diary to keep track of foods that cause symptoms.  ? Alcohol.  ? Drinking large amounts of liquid with meals.  ? Eating meals during the 2-3 hours before bed.  · Cook foods using methods other than frying. This may include baking, grilling, or broiling.  Lifestyle  · Maintain a healthy weight. Ask your health care provider what weight is healthy for you. If you need to lose weight, work with your health care provider to do so safely.  · Exercise for at least 30 minutes on 5 or more days each week, or as told by your health care provider.  · Avoid wearing clothes that fit tightly around your waist and chest.  · Do not use any products that contain nicotine or tobacco, such as cigarettes and e-cigarettes. If you need help quitting, ask your health care provider.  · Sleep with the head of your bed raised. Use a wedge under the mattress or blocks under the bed frame to raise the head of the bed.  What foods are not recommended?  The items listed may not be a complete  list. Talk with your dietitian about what dietary choices are best for you.  Grains  Pastries or quick breads with added fat. French toast.  Vegetables  Deep fried vegetables. French fries. Any vegetables prepared with added fat. Any vegetables that cause symptoms. For some people this may include tomatoes and tomato products, chili peppers, onions and garlic, and horseradish.  Fruits  Any fruits prepared with added fat. Any fruits that cause symptoms. For some people this may include citrus fruits, such as oranges, grapefruit, pineapple, and lemons.  Meats and other protein foods  High-fat meats, such as fatty beef or pork, hot dogs, ribs, ham, sausage, salami and bacon. Fried meat or protein, including fried fish and fried chicken. Nuts and nut butters.  Dairy  Whole milk and chocolate milk. Sour cream. Cream. Ice cream. Cream cheese. Milk shakes.  Beverages  Coffee and tea, with or without caffeine. Carbonated beverages. Sodas. Energy drinks. Fruit juice made with acidic fruits (such as orange or grapefruit). Tomato juice. Alcoholic drinks.  Fats and oils  Butter. Margarine. Shortening. Ghee.  Sweets and desserts  Chocolate and cocoa. Donuts.  Seasoning and other foods  Pepper. Peppermint and spearmint. Any condiments, herbs, or seasonings that cause symptoms. For some people, this may include curry, hot sauce, or vinegar-based salad dressings.  Summary  · When you have gastroesophageal reflux disease (GERD), food and lifestyle choices are very   important to help ease the discomfort of GERD.  · Eat frequent, small meals instead of three large meals each day. Eat your meals slowly, in a relaxed setting. Avoid bending over or lying down until 2-3 hours after eating.  · Limit high-fat foods such as fatty meat or fried foods.  This information is not intended to replace advice given to you by your health care provider. Make sure you discuss any questions you have with your health care provider.  Document Released:  08/03/2005 Document Revised: 08/04/2016 Document Reviewed: 08/04/2016  Elsevier Interactive Patient Education © 2019 [employer]

## 2018-10-12 NOTE — Progress Notes (Signed)
Impression and Recommendations:    1. Essential hypertension   2. Glucose intolerance (impaired glucose tolerance)   3. Gastroesophageal reflux disease, esophagitis presence not specified   4. High triglycerides   5. Obesity, Class III, BMI 40-49.9 (morbid obesity) ([place])   6. Vitamin D insufficiency   7. Metabolic syndrome   8. Healthcare maintenance     1. Healthcare Maintenance - Discussed need for patient to continue to obtain regular health management, including screenings with all established specialists.  Educated patient at length about the critical importance of keeping health maintenance up to date.  - Participated in lengthy conversation and all questions were answered.  - Need for lab work.  Patient is fasting today.  2. Seasonal Allergies - Recurrent Sinus Headaches - Strongly advised patient to discontinue regular use of sudafed, and only use rarely PRN.  - To help prevent her sinus concerns, advised the patient to begin using AYR or Neilmed sinus rinses BID followed by flonase BID (one spray to each nostril). Advised that the patient may also incorporate allegra PRN.  Discontinue Claritin.  - Encouraged patient to resume use of Singulair (montelukast).  - Extensively discussed prudent allergy treatment and prevention.  3. Essential Hypertension - Stable at this time. - Continue treatment plan as prescribed. - Patient tolerating meds well without complication.  - Ongoing prudent lifestyle changes such as dash diet and engaging in a regular exercise program discussed with patient.  Educational handouts provided  - Ambulatory BP monitoring encouraged. Keep log and bring in next OV.  4. High Triglycerides - Need for lab work today. - Continue fenofibrate as prescribed. - Will continue to monitor.  - Dietary changes such as low saturated & trans fat and low carb/high protein diets discussed with patient.  Encouraged regular exercise and weight loss when  appropriate.   - Educational handouts provided at patient's desire.  5. GERD - Begin omeprazole.  Will continue to monitor.  - Counseling & education regarding GERD provided face-to-face today. - Discussed prudent reflux prevention habits at length with patient today.  - Handout on reflux provided today. - Advised patient to avoid triggers and avoid eating or drinking at night before sleeping.  6. BMI Counseling - BMI of 43.77 Explained to patient what BMI refers to, and what it means medically.    Told patient to think about it as a "medical risk stratification measurement" and how increasing BMI is associated with increasing risk/ or worsening state of various diseases such as hypertension, hyperlipidemia, diabetes, premature OA, depression etc.  American Heart Association guidelines for healthy diet, basically Mediterranean diet, and exercise guidelines of 30 minutes 5 days per week or more discussed in detail.  Health counseling performed.  All questions answered.  - Encouraged patient to begin a program like Weight Watchers.  - Prudent nutrition extensively reviewed with patient today, increasing protein intake and decreasing carbs.  Extensive education provided.    7. Lifestyle & Preventative Health Maintenance - Advised patient to continue working toward exercising to improve overall mental, physical, and emotional health.    - Encouraged patient to engage in daily physical activity, especially a formal exercise routine.  Recommended that the patient eventually strive for at least 150 minutes of moderate cardiovascular activity per week according to guidelines established by the [facility].   - Healthy dietary habits encouraged, including low-carb, and high amounts of lean protein in diet.   - Patient should also consume adequate amounts of water.  Orders Placed This Encounter  Procedures  . CBC with Differential/Platelet  . Comprehensive metabolic panel  . Hemoglobin A1c  .  Lipid panel  . T4, free  . TSH  . VITAMIN D 25 Hydroxy (Vit-D Deficiency, Fractures)    Meds ordered this encounter  Medications  . albuterol (PROVENTIL HFA;VENTOLIN HFA) 108 (90 Base) MCG/ACT inhaler    Sig: Inhale 2 puffs into the lungs every 6 (six) hours as needed for wheezing.    Dispense:  2 Inhaler    Refill:  0  . hydrochlorothiazide (HYDRODIURIL) 25 MG tablet    Sig: Take 1 tablet by mouth daily.    Dispense:  90 tablet    Refill:  0  . fenofibrate 54 MG tablet    Sig: Take 1 tablet (54 mg total) by mouth daily.    Dispense:  90 tablet    Refill:  0  . montelukast (SINGULAIR) 10 MG tablet    Sig: Take 1 tablet (10 mg total) by mouth at bedtime.    Dispense:  90 tablet    Refill:  0  . fluticasone (FLONASE) 50 MCG/ACT nasal spray    Sig: 1 spray each nostril following sinus rinses twice daily    Dispense:  16 g    Refill:  2  . fexofenadine (ALLEGRA) 180 MG tablet    Sig: Take 1 tablet (180 mg total) by mouth daily.    Dispense:  90 tablet    Refill:  1  . omeprazole (PRILOSEC) 20 MG capsule    Sig: Take 1 capsule (20 mg total) by mouth daily.    Dispense:  90 capsule    Refill:  0    Medications Discontinued During This Encounter  Medication Reason  . ranitidine (ZANTAC) 150 MG capsule No longer needed (for PRN medications)  . loratadine (CLARITIN) 10 MG tablet   . albuterol (PROVENTIL HFA;VENTOLIN HFA) 108 (90 Base) MCG/ACT inhaler Reorder  . hydrochlorothiazide (HYDRODIURIL) 25 MG tablet Reorder  . fenofibrate 54 MG tablet Reorder  . montelukast (SINGULAIR) 10 MG tablet Reorder     Gross side effects, risk and benefits, and alternatives of medications and treatment plan in general discussed with patient.  Patient is aware that all medications have potential side effects and we are unable to predict every side effect or drug-drug interaction that may occur.   Patient will call with any questions prior to using medication if they have concerns.    Expresses  verbal understanding and consents to current therapy and treatment regimen.  No barriers to understanding were identified.  Red flag symptoms and signs discussed in detail.  Patient expressed understanding regarding what to do in case of emergency\urgent symptoms  Please see AVS handed out to patient at the end of our visit for further patient instructions/ counseling done pertaining to today's office visit.   Return in about 6 months (around 04/12/2019) for added omepraz for GERD; HTN, wt loss .     Note:  This note was prepared with assistance of Dragon voice recognition software. Occasional wrong-word or sound-a-like substitutions may have occurred due to the inherent limitations of voice recognition software.    This document serves as a record of services personally performed by Mellody Dance, DO. It was created on her behalf by Toni Amend, a trained medical scribe. The creation of this record is based on the scribe's personal observations and the provider's statements to them.   I have reviewed the above medical documentation for accuracy and  completeness and I concur.  Mellody Dance, DO 10/14/2018 4:57 PM       --------------------------------------------------------------------------------------------------------------------------------   Subjective:     HPI: Nicole Cooper is a 53 y.o. female who presents to [place] [facility] today for issues as discussed below.  Notes she keeps her granddaughter most days, and gets Thursday and Fridays off, usually.    GERD Notes lately her "food has been coming up," and asks if this is GERD.  Seasonal Allergies - Chronic Sinus Headaches Notes she experiences headaches behind her eyes this time of year.  States she's been "eating sudafed like candy because my head hurts behind my eyes."  Patient has not been taking her Singulair as prescribed, but notes this would help.  Notes Claritin also  helps.  Hyperlipidemia & Eating Habits Continues taking fenofibrate.  Feels she does the keto diet 80% of the time, but "you can't really lose any weight doing 80% of keto."  Notes she does not particularly enjoy the keto diet.  Sleep Habits Notes "I need nine hours and I probably get 7-8.  Says "it's just not enough for me."  1. HTN HPI:  Patient denies concerns about blood pressure. Notes she does not check it at home.  - Patient reports good compliance with blood pressure medications.  Specifically notes she "loves her HCTZ."  - Denies medication S-E   - Smoking Status noted   - She denies new onset of: chest pain, exercise intolerance, shortness of breath, dizziness, visual changes, headache, lower extremity swelling or claudication.   Last 3 blood pressure readings in our office are as follows: BP Readings from Last 3 Encounters:  10/12/18 123/87  03/16/17 126/83  03/10/16 117/76    Filed Weights   10/12/18 0845  Weight: 259 lb (117.5 kg)     Wt Readings from Last 3 Encounters:  10/12/18 259 lb (117.5 kg)  03/16/17 257 lb 14.4 oz (117 kg)  03/10/16 266 lb 4.8 oz (120.8 kg)   BP Readings from Last 3 Encounters:  10/12/18 123/87  03/16/17 126/83  03/10/16 117/76   Pulse Readings from Last 3 Encounters:  10/12/18 87  03/16/17 76  03/10/16 80   BMI Readings from Last 3 Encounters:  10/12/18 43.77 kg/m  03/16/17 43.25 kg/m  03/10/16 44.66 kg/m     Patient Care Team    Relationship Specialty Notifications Start End  Mellody Dance, DO PCP - General Family Medicine  03/02/16      Patient Active Problem List   Diagnosis Date Noted  . Obesity, Class III, BMI 40-49.9 (morbid obesity) ([place]) 03/03/2016    Priority: High  . HTN (hypertension)     Priority: High  . High triglycerides     Priority: High  . Glucose intolerance (impaired glucose tolerance) 03/10/2016    Priority: Medium  . S/P hysterectomy with oophorectomy 03/03/2016    Priority:  Medium  . GERD (gastroesophageal reflux disease) 03/03/2016    Priority: Medium  . Vitamin D insufficiency 03/16/2017    Priority: Low  . Emotional upset- due to her wt 03/16/2017    Priority: Low  . Metabolic syndrome 41/96/2229    Priority: Low  . Plantar fasciitis of left foot 03/03/2016    Priority: Low  . Physical deconditioning 03/03/2016    Priority: Low  . Environmental and seasonal allergies 03/03/2016    Priority: Low  . Sinusitis, acute recurrent 03/10/2016  . Feared condition not demonstrated 03/03/2016  . Post-nasal drip   .  Hx of bronchitis   . Dental crowns present   . Fluid retention   . h/o Hair loss   . History of urticaria   . Breathing problem   . Chest heaviness   . Dyspnea   . Carpal tunnel syndrome of right wrist 12/15/2012    Past Medical history, Surgical history, Family history, Social history, Allergies and Medications have been entered into the medical record, reviewed and changed as needed.    Current Meds  Medication Sig  . albuterol (PROVENTIL HFA;VENTOLIN HFA) 108 (90 Base) MCG/ACT inhaler Inhale 2 puffs into the lungs every 6 (six) hours as needed for wheezing.  . fenofibrate 54 MG tablet Take 1 tablet (54 mg total) by mouth daily.  . montelukast (SINGULAIR) 10 MG tablet Take 1 tablet (10 mg total) by mouth at bedtime.  Marland Kitchen PREMARIN 0.625 MG tablet Take 0.625 mg by mouth daily.  . Probiotic Product (PROBIOTIC-10 PO) Take 1 tablet by mouth daily.  Marland Kitchen UNABLE TO FIND Med Name: infrarosa  . [DISCONTINUED] albuterol (PROVENTIL HFA;VENTOLIN HFA) 108 (90 Base) MCG/ACT inhaler Inhale 2 puffs into the lungs every 6 (six) hours as needed for wheezing.  . [DISCONTINUED] fenofibrate 54 MG tablet Take 1 tablet (54 mg total) by mouth daily. Patient needs office visit for further refills  . [DISCONTINUED] loratadine (CLARITIN) 10 MG tablet Take 20 mg by mouth daily. TAKE 2 TABLETS BY MOUTH DAILY  . [DISCONTINUED] montelukast (SINGULAIR) 10 MG tablet Take 1  tablet (10 mg total) by mouth at bedtime. Patient needs office visit for further refills    Allergies:  Allergies  Allergen Reactions  . Keflex [Cephalexin] Itching and Other (See Comments)    Yeast infection  . Percocet [Oxycodone-Acetaminophen] Itching  . Sulfa Antibiotics Rash     Review of Systems:  A fourteen system review of systems was performed and found to be positive as per HPI.   Objective:   Blood pressure 123/87, pulse 87, temperature 98.2 F (36.8 C), height 5' 4.5" (1.638 m), weight 259 lb (117.5 kg), SpO2 98 %. Body mass index is 43.77 kg/m. General:  Well Developed, well nourished, appropriate for stated age.  Neuro:  Alert and oriented,  extra-ocular muscles intact  HEENT:  Normocephalic, atraumatic, neck supple, no carotid bruits appreciated  Skin:  no gross rash, warm, pink. Cardiac:  RRR, S1 S2 Respiratory:  ECTA B/L and A/P, Not using accessory muscles, speaking in full sentences- unlabored. Vascular:  Ext warm, no cyanosis apprec.; cap RF less 2 sec. Psych:  No HI/SI, judgement and insight good, Euthymic mood. Full Affect.

## 2018-10-13 LAB — CBC WITH DIFFERENTIAL/PLATELET
Basophils Absolute: 0.1 10*3/uL (ref 0.0–0.2)
Basos: 1 %
EOS (ABSOLUTE): 0.1 10*3/uL (ref 0.0–0.4)
Eos: 1 %
Hematocrit: 41.8 % (ref 34.0–46.6)
Hemoglobin: 14.7 g/dL (ref 11.1–15.9)
IMMATURE GRANS (ABS): 0 10*3/uL (ref 0.0–0.1)
IMMATURE GRANULOCYTES: 0 %
LYMPHS: 42 %
Lymphocytes Absolute: 3.1 10*3/uL (ref 0.7–3.1)
MCH: 30.1 pg (ref 26.6–33.0)
MCHC: 35.2 g/dL (ref 31.5–35.7)
MCV: 86 fL (ref 79–97)
MONOS ABS: 0.4 10*3/uL (ref 0.1–0.9)
Monocytes: 6 %
NEUTROS PCT: 50 %
Neutrophils Absolute: 3.7 10*3/uL (ref 1.4–7.0)
PLATELETS: 276 10*3/uL (ref 150–450)
RBC: 4.89 x10E6/uL (ref 3.77–5.28)
RDW: 14.3 % (ref 11.7–15.4)
WBC: 7.2 10*3/uL (ref 3.4–10.8)

## 2018-10-13 LAB — COMPREHENSIVE METABOLIC PANEL
A/G RATIO: 1.5 (ref 1.2–2.2)
ALT: 17 IU/L (ref 0–32)
AST: 23 IU/L (ref 0–40)
Albumin: 4.4 g/dL (ref 3.8–4.9)
Alkaline Phosphatase: 92 IU/L (ref 39–117)
BUN/Creatinine Ratio: 25 — ABNORMAL HIGH (ref 9–23)
BUN: 18 mg/dL (ref 6–24)
Bilirubin Total: 0.3 mg/dL (ref 0.0–1.2)
CO2: 23 mmol/L (ref 20–29)
CREATININE: 0.73 mg/dL (ref 0.57–1.00)
Calcium: 9.5 mg/dL (ref 8.7–10.2)
Chloride: 102 mmol/L (ref 96–106)
GFR, EST AFRICAN AMERICAN: 110 mL/min/{1.73_m2} (ref >59)
GFR, EST NON AFRICAN AMERICAN: 96 mL/min/{1.73_m2} (ref >59)
GLUCOSE: 108 mg/dL — AB (ref 65–99)
Globulin, Total: 2.9 g/dL (ref 1.5–4.5)
POTASSIUM: 4.3 mmol/L (ref 3.5–5.2)
Sodium: 140 mmol/L (ref 134–144)
TOTAL PROTEIN: 7.3 g/dL (ref 6.0–8.5)

## 2018-10-13 LAB — HEMOGLOBIN A1C
ESTIMATED AVERAGE GLUCOSE: 117 mg/dL
Hgb A1c MFr Bld: 5.7 % — ABNORMAL HIGH (ref 4.8–5.6)

## 2018-10-13 LAB — LIPID PANEL
Chol/HDL Ratio: 3.8 ratio (ref 0.0–4.4)
Cholesterol, Total: 208 mg/dL — ABNORMAL HIGH (ref 100–199)
HDL: 55 mg/dL (ref >39)
LDL CALC: 117 mg/dL — AB (ref 0–99)
TRIGLYCERIDES: 180 mg/dL — AB (ref 0–149)
VLDL CHOLESTEROL CAL: 36 mg/dL (ref 5–40)

## 2018-10-13 LAB — VITAMIN D 25 HYDROXY (VIT D DEFICIENCY, FRACTURES): Vit D, 25-Hydroxy: 26.4 ng/mL — ABNORMAL LOW (ref 30.0–100.0)

## 2018-10-13 LAB — T4, FREE: Free T4: 1.08 ng/dL (ref 0.82–1.77)

## 2018-10-13 LAB — TSH: TSH: 1.51 u[IU]/mL (ref 0.450–4.500)

## 2018-10-18 ENCOUNTER — Other Ambulatory Visit: Payer: Self-pay | Admitting: Family Medicine

## 2018-10-18 ENCOUNTER — Telehealth: Payer: Self-pay | Admitting: Family Medicine

## 2018-10-18 NOTE — Telephone Encounter (Signed)
Patient called to get Lab results --advised they were in by not release by provider to share --- forwarding message to medical assistant for review for clearance--- Per patient she has Mychart.  --glh

## 2018-10-19 NOTE — Telephone Encounter (Signed)
Please see result note. MPulliam, CMA/RT(R)

## 2018-10-20 ENCOUNTER — Other Ambulatory Visit: Payer: Self-pay

## 2018-10-20 DIAGNOSIS — R7302 Impaired glucose tolerance (oral): Secondary | ICD-10-CM

## 2018-10-20 DIAGNOSIS — E7800 Pure hypercholesterolemia, unspecified: Secondary | ICD-10-CM

## 2018-10-20 DIAGNOSIS — E559 Vitamin D deficiency, unspecified: Secondary | ICD-10-CM

## 2018-10-20 MED ORDER — VITAMIN D (ERGOCALCIFEROL) 1.25 MG (50000 UNIT) PO CAPS: 50000.0000 [IU] | ORAL_CAPSULE | ORAL | 1 refills | Status: DC

## 2019-01-16 ENCOUNTER — Other Ambulatory Visit: Payer: Self-pay | Admitting: Family Medicine

## 2019-02-16 DIAGNOSIS — D125 Benign neoplasm of sigmoid colon: Secondary | ICD-10-CM | POA: Diagnosis not present

## 2019-02-16 DIAGNOSIS — D124 Benign neoplasm of descending colon: Secondary | ICD-10-CM | POA: Diagnosis not present

## 2019-02-16 DIAGNOSIS — Z1211 Encounter for screening for malignant neoplasm of colon: Secondary | ICD-10-CM | POA: Diagnosis not present

## 2019-02-16 DIAGNOSIS — D123 Benign neoplasm of transverse colon: Secondary | ICD-10-CM | POA: Diagnosis not present

## 2019-02-16 LAB — HM COLONOSCOPY

## 2019-02-22 LAB — HM COLONOSCOPY

## 2019-03-24 ENCOUNTER — Ambulatory Visit: Payer: BLUE CROSS/BLUE SHIELD | Admitting: Family Medicine

## 2019-03-25 DIAGNOSIS — M79601 Pain in right arm: Secondary | ICD-10-CM | POA: Diagnosis not present

## 2019-03-25 DIAGNOSIS — T148XXA Other injury of unspecified body region, initial encounter: Secondary | ICD-10-CM | POA: Diagnosis not present

## 2019-03-25 DIAGNOSIS — R1011 Right upper quadrant pain: Secondary | ICD-10-CM | POA: Diagnosis not present

## 2019-03-25 DIAGNOSIS — S46101A Unspecified injury of muscle, fascia and tendon of long head of biceps, right arm, initial encounter: Secondary | ICD-10-CM | POA: Diagnosis not present

## 2019-03-25 DIAGNOSIS — X58XXXA Exposure to other specified factors, initial encounter: Secondary | ICD-10-CM | POA: Diagnosis not present

## 2019-03-25 DIAGNOSIS — S46811A Strain of other muscles, fascia and tendons at shoulder and upper arm level, right arm, initial encounter: Secondary | ICD-10-CM | POA: Diagnosis not present

## 2019-03-25 DIAGNOSIS — M25511 Pain in right shoulder: Secondary | ICD-10-CM | POA: Diagnosis not present

## 2019-03-30 ENCOUNTER — Other Ambulatory Visit: Payer: Self-pay

## 2019-03-30 ENCOUNTER — Encounter: Payer: Self-pay | Admitting: Family Medicine

## 2019-03-30 ENCOUNTER — Ambulatory Visit: Payer: BC Managed Care – PPO | Admitting: Family Medicine

## 2019-03-30 VITALS — BP 136/89 | HR 85 | Temp 98.8°F | Ht 64.5 in | Wt 269.3 lb

## 2019-03-30 DIAGNOSIS — I10 Essential (primary) hypertension: Secondary | ICD-10-CM

## 2019-03-30 DIAGNOSIS — K219 Gastro-esophageal reflux disease without esophagitis: Secondary | ICD-10-CM

## 2019-03-30 DIAGNOSIS — E781 Pure hyperglyceridemia: Secondary | ICD-10-CM

## 2019-03-30 DIAGNOSIS — E7800 Pure hypercholesterolemia, unspecified: Secondary | ICD-10-CM | POA: Diagnosis not present

## 2019-03-30 DIAGNOSIS — E559 Vitamin D deficiency, unspecified: Secondary | ICD-10-CM | POA: Diagnosis not present

## 2019-03-30 DIAGNOSIS — R7302 Impaired glucose tolerance (oral): Secondary | ICD-10-CM

## 2019-03-30 NOTE — Progress Notes (Signed)
Impression and Recommendations:    1. Glucose intolerance (impaired glucose tolerance)   2. Gastroesophageal reflux disease, esophagitis presence not specified   3. Essential hypertension   4. Elevated LDL cholesterol level   5. High triglycerides   6. Vitamin D deficiency   7. Obesity, Class III, BMI 40-49.9 (morbid obesity) ([place])     - Advised patient about options for tele health visits during [facility].  - lab work drawn today.  GERD - Controlled on omeprazole. - Continue management as prescribed.  See med list. - Patient tolerating meds well without complication.  Denies S-E  - Will continue to monitor.  Allergies - Continue on Singulair as prescribed. - Continue rare use of albuterol inhaler only as needed. - Patient knows she may prudently incorporate a daily antihistamine PRN. - Patient tolerating meds well without complication.  Denies S-E  - Continue to monitor.  Essential Hypertension - Stable at this time. - Continue treatment plan as prescribed.  See med list. - Patient tolerating meds well without complication.  Denies S-E  - Lifestyle changes such as dash diet and engaging in a regular exercise program discussed with patient.  Educational handouts provided  - Ambulatory BP monitoring encouraged. Keep log and bring in next OV . High Triglycerides, Elevated LDL - Patient was on Fenofibrate for elevated Triglycerides. - Patient discontinued after unknown amount of time taking it, maybe after couple of weeks. - Need for lab re-check in very near future.  - Extensive education was provided to patient regarding medical management of cholesterol.  - Dietary changes such as low saturated & trans fat and low carb/ ketogenic diets discussed with patient.  Encouraged regular exercise and weight loss when appropriate.   - Discussed critical importance of healthier habits if patient wishes to treat her medical issues with diet.  - Educational handouts  provided at patient's desire.  Glucose Intolerance - Counseled patient on prevention of disease and discussed dietary and lifestyle modifications as first line.  Importance of low carb/ketogenic diet discussed with patient in addition to regular exercise.   - Re-check A1c as recommended.  - Will continue to monitor.  Vitamin D Deficiency - Per pt, continues on supplementation as prescribed.   - No change to treatment plan made today.  See med list. - Will re-check as recommended. - Will continue to monitor.  BMI Counseling - Body mass index is 45.51 kg/m Explained to patient what BMI refers to, and what it means medically.  Told patient to think about it as a "medical risk stratification measurement" and how increasing BMI is associated with increasing risk/ or worsening state of various diseases such as hypertension, hyperlipidemia, diabetes, premature OA, depression etc.  American Heart Association guidelines for healthy diet, basically Mediterranean diet, and exercise guidelines of 30 minutes 5 days per week or more discussed in detail.  - Discussed importance of prudent diet and other healthy habits to improve overall feelings of wellbeing.  Health counseling performed.  All questions answered.  - Encouraged patient not to feel guilty about weight gain during COVID.  Discussed importance of being kind to herself, choosing a plan and sticking with it if she desires to resume weight loss.  - Advised patient to continue [facility] tracking goals.  - Discussed that patient has many avenues available to her to assist with weight loss if desired.  Lifestyle & Preventative Health Maintenance - Advised patient to continue working toward exercising to improve overall mental, physical, and emotional  health.    - Reviewed the "spokes of the wheel" of mood and health management.  Stressed the importance of ongoing prudent habits, including regular exercise, appropriate sleep hygiene, healthful  dietary habits, and prayer/meditation to relax.  - Encouraged patient to engage in daily physical activity, especially a formal exercise routine.  Recommended that the patient eventually strive for at least 150 minutes of moderate cardiovascular activity per week according to guidelines established by the [facility]   - Healthy dietary habits encouraged, including low-carb, and high amounts of lean protein in diet.   - Patient should also consume adequate amounts of water.  Recommendations - Re-check Vitamin D, Lipid Panel, and A1c today. - Advised patient to return for CPE in future as recommended. - Education about need for physical provided today.  - Patient will let us know if she has concerns, or would like referral to weight loss center.     Expresses verbal understanding and consents to current therapy and treatment regimen.  No barriers to understanding were identified.  Red flag symptoms and signs discussed in detail.  Patient expressed understanding regarding what to do in case of emergency\urgent symptoms  Please see AVS handed out to patient at the end of our visit for further patient instructions/ counseling done pertaining to today's office visit.   Return for 45mo CPE / full set then of bldwrk and sooner if concerns/ issues.     Note:  This note was prepared with assistance of Dragon voice recognition software. Occasional wrong-word or sound-a-like substitutions may have occurred due to the inherent limitations of voice recognition software.    This document serves as a record of services personally performed by Mellody Dance, DO. It was created on her behalf by Toni Amend, a trained medical scribe. The creation of this record is based on the scribe's personal observations and the provider's statements to them.   I have reviewed the above medical documentation for accuracy and completeness and I concur.  Mellody Dance, DO 03/30/2019 10:02 PM        --------------------------------------------------------------------------------------------------------------------------------------------------------------------------------------------------------------------------------------------    Subjective:     HPI: Nicole Cooper is a 53 y.o. female who presents to [facility] today for issues as discussed below.  Patient notes she's here today because she took her husband's formerly scheduled appointment.  She's mainly watching the grandkids these days.  Says "I have to run after a 52 year old; she keeps me on my toes."  Says "it wears me out and I'm actually pretty tired by the time I'm done."  Says she's been taking her Vitamin D, sleeping better, and overall "feels good."  Weight During COVID Notes she's gained weight since quarantining during COVID and feels angry about it, because she had lost 15 lbs prior.  Says she's been cooking a lot at home and eating a lot more recently.  Patient resumed the [facility] of Weight Wat[employer] last week.  GERD Feels her heartburn has been good.  Notes that when she takes the omeprazole, she has no symptoms.  She feels that spicy foods are definitely a trigger, and she loves spicy foods.  Allergies Continues on Singulair.  Does not use or feel the need to use her albuterol inhaler but rarely.  Says that the Allegra "doesn't touch" her symptoms.  Specialty Follow-Up Notes she follows up with OBGYN and dermatology time to time.   First Colonoscopy Had her first colonoscopy through [facility] very recently, in early July  of 2020.  1. HTN HPI:   -  Her blood pressure has reportedly been controlled at home, but patient is not regularly checking it at home.   Feels her blood pressure has been good, around 120/80-something; "I don't have any problem with that at all."  Says she stopped taking the Fenofibrate.   - Patient reports good compliance with blood pressure  medications  - Denies medication S-E   - Smoking Status noted   - She denies new onset of: chest pain, exercise intolerance, shortness of breath, dizziness, visual changes, headache, lower extremity swelling or claudication.   Last 3 blood pressure readings in our office are as follows: BP Readings from Last 3 Encounters:  03/30/19 136/89  10/12/18 123/87  03/16/17 126/83    Filed Weights   03/30/19 0813  Weight: 269 lb 4.8 oz (122.2 kg)    Wt Readings from Last 3 Encounters:  03/30/19 269 lb 4.8 oz (122.2 kg)  10/12/18 259 lb (117.5 kg)  03/16/17 257 lb 14.4 oz (117 kg)   BP Readings from Last 3 Encounters:  03/30/19 136/89  10/12/18 123/87  03/16/17 126/83   Pulse Readings from Last 3 Encounters:  03/30/19 85  10/12/18 87  03/16/17 76   BMI Readings from Last 3 Encounters:  03/30/19 45.51 kg/m  10/12/18 43.77 kg/m  03/16/17 43.25 kg/m     Patient Care Team    Relationship Specialty Notifications Start End  Mellody Dance, DO PCP - General Family Medicine  03/02/16   Louretta Shorten, MD Consulting Physician Obstetrics and Gynecology  03/30/19   Williams, [place] Physician Dermatology  03/30/19   Ronnette Juniper, MD Consulting Physician Gastroenterology  03/30/19    Comment: [facility]     Patient Active Problem List   Diagnosis Date Noted  . Obesity, Class III, BMI 40-49.9 (morbid obesity) ([place]) 03/03/2016    Priority: High  . HTN (hypertension)     Priority: High  . High triglycerides     Priority: High  . Glucose intolerance (impaired glucose tolerance) 03/10/2016    Priority: Medium  . S/P hysterectomy with oophorectomy 03/03/2016    Priority: Medium  . GERD (gastroesophageal reflux disease) 03/03/2016    Priority: Medium  . Vitamin D insufficiency 03/16/2017    Priority: Low  . Emotional upset- due to her wt 03/16/2017    Priority: Low  . Metabolic syndrome 65/10/5463    Priority: Low  . Plantar fasciitis of left foot 03/03/2016     Priority: Low  . Physical deconditioning 03/03/2016    Priority: Low  . Environmental and seasonal allergies 03/03/2016    Priority: Low  . Sinusitis, acute recurrent 03/10/2016  . Feared condition not demonstrated 03/03/2016  . Post-nasal drip   . Hx of bronchitis   . Dental crowns present   . Fluid retention   . h/o Hair loss   . History of urticaria   . Breathing problem   . Chest heaviness   . Dyspnea   . Carpal tunnel syndrome of right wrist 12/15/2012    Past Medical history, Surgical history, Family history, Social history, Allergies and Medications have been entered into the medical record, reviewed and changed as needed.    Current Meds  Medication Sig  . UNABLE TO FIND Med Name: infrarosa  . [DISCONTINUED] hydrochlorothiazide (HYDRODIURIL) 25 MG tablet TAKE 1 TABLET BY MOUTH EVERY DAY  . [DISCONTINUED] montelukast (SINGULAIR) 10 MG tablet TAKE 1 TABLET BY MOUTH EVERYDAY AT BEDTIME  . [DISCONTINUED]  omeprazole (PRILOSEC) 20 MG capsule TAKE 1 CAPSULE BY MOUTH EVERY DAY  . [DISCONTINUED] Vitamin D, Ergocalciferol, (DRISDOL) 1.25 MG (50000 UT) CAPS capsule Take 1 capsule (50,000 Units total) by mouth every 7 (seven) days.    Allergies:  Allergies  Allergen Reactions  . Keflex [Cephalexin] Itching and Other (See Comments)    Yeast infection  . Percocet [Oxycodone-Acetaminophen] Itching  . Sulfa Antibiotics Rash     Review of Systems:  A fourteen system review of systems was performed and found to be positive as per HPI.   Objective:   Blood pressure 136/89, pulse 85, temperature 98.8 F (37.1 C), height 5' 4.5" (1.638 m), weight 269 lb 4.8 oz (122.2 kg), SpO2 99 %. Body mass index is 45.51 kg/m. General:  Well Developed, well nourished, appropriate for stated age.  Neuro:  Alert and oriented,  extra-ocular muscles intact  HEENT:  Normocephalic, atraumatic, neck supple, no carotid bruits appreciated  Skin:  no gross rash, warm, pink. Cardiac:  RRR, S1 S2  Respiratory:  ECTA B/L and A/P, Not using accessory muscles, speaking in full sentences- unlabored. Vascular:  Ext warm, no cyanosis apprec.; cap RF less 2 sec. Psych:  No HI/SI, judgement and insight good, Euthymic mood. Full Affect.

## 2019-03-30 NOTE — Patient Instructions (Addendum)
Look into the [place] program if desired.  Please let me know if you ever want to be referred to the weight loss center through [facility]     Behavior Modification Ideas for Weight Management  Weight management involves adopting a healthy lifestyle that includes a knowledge of nutrition and exercise, a positive attitude and the right kind of motivation. Internal motives such as better health, increased energy, self-esteem and personal control increase your chances of lifelong weight management success.  Remember to have realistic goals and think long-term success. Believe in yourself and you can do it. The following information will give you ideas to help you meet your goals.  Control Your Home Environment  Eat only while sitting down at the kitchen or dining room table. Do not eat while watching television, reading, cooking, talking on the phone, standing at the refrigerator or working on the computer. Keep tempting foods out of the house -- don't buy them. Keep tempting foods out of sight. Have low-calorie foods ready to eat. Unless you are preparing a meal, stay out of the kitchen. Have healthy snacks at your disposal, such as small pieces of fruit, vegetables, canned fruit, pretzels, low-fat string cheese and nonfat cottage cheese.  Control Your Work Environment  Do not eat at [employer] or keep tempting snacks at your desk. If you get hungry between meals, plan healthy snacks and bring them with you to work. During your breaks, go for a walk instead of eating. If you work around food, plan in advance the one item you will eat at mealtime. Make it inconvenient to nibble on food by chewing gum, sugarless candy or drinking water or another low-calorie beverage. Do not work through meals. Skipping meals slows down metabolism and may result in overeating at the next meal. If food is available for special occasions, either pick the healthiest item, nibble on low-fat snacks brought from home,  don't have anything offered, choose one option and have a small amount, or have only a beverage.  Control Your Mealtime Environment  Serve your plate of food at the stove or kitchen counter. Do not put the serving dishes on the table. If you do put dishes on the table, remove them immediately when finished eating. Fill half of your plate with vegetables, a quarter with lean protein and a quarter with starch. Use smaller plates, bowls and glasses. A smaller portion will look large when it is in a little dish. Politely refuse second helpings. When fixing your plate, limit portions of food to one scoop/serving or less.   Daily Food Management  Replace eating with another activity that you will not associate with food. Wait 20 minutes before eating something you are craving. Drink a large glass of water or diet soda before eating. Always have a big glass or bottle of water to drink throughout the day. Avoid high-calorie add-ons such as cream with your coffee, butter, mayonnaise and salad dressings.  Shopping: Do not shop when hungry or tired. Shop from a list and avoid buying anything that is not on your list. If you must have tempting foods, buy individual-sized packages and try to find a lower-calorie alternative. Don't taste test in the store. Read food labels. Compare products to help you make the healthiest choices.  Preparation: Chew a piece of gum while cooking meals. Use a quarter teaspoon if you taste test your food. Try to only fix what you are going to eat, leaving yourself no chance for seconds. If you have prepared  more food than you need, portion it into individual containers and freeze or refrigerate immediately. Don't snack while cooking meals.  Eating: Eat slowly. Remember it takes about 20 minutes for your stomach to send a message to your brain that it is full. Don't let fake hunger make you think you need more. The ideal way to eat is to take a bite, put your  utensil down, take a sip of water, cut your next bite, take a bit, put your utensil down and so on. Do not cut your food all at one time. Cut only as needed. Take small bites and chew your food well. Stop eating for a minute or two at least once during a meal or snack. Take breaks to reflect and have conversation.  Cleanup and Leftovers: Label leftovers for a specific meal or snack. Freeze or refrigerate individual portions of leftovers. Do not clean up if you are still hungry.  Eating Out and Social Eating  Do not arrive hungry. Eat something light before the meal. Try to fill up on low-calorie foods, such as vegetables and fruit, and eat smaller portions of the high-calorie foods. Eat foods that you like, but choose small portions. If you want seconds, wait at least 20 minutes after you have eaten to see if you are actually hungry or if your eyes are bigger than your stomach. Limit alcoholic beverages. Try a soda water with a twist of lime. Do not skip other meals in the day to save room for the special event.  At Restaurants: Order  la carte rather than buffet style. Order some vegetables or a salad for an appetizer instead of eating bread. If you order a high-calorie dish, share it with someone. Try an after-dinner mint with your coffee. If you do have dessert, share it with two or more people. Don't overeat because you do not want to waste food. Ask for a doggie bag to take extra food home. Tell the server to put half of your entree in a to go bag before the meal is served to you. Ask for salad dressing, gravy or high-fat sauces on the side. Dip the tip of your fork in the dressing before each bite. If bread is served, ask for only one piece. Try it plain without butter or oil. At [employer] where oil and vinegar is served with bread, use only a small amount of oil and a lot of vinegar for dipping.  At a Friend[facility] House: Offer to bring a dish, appetizer or dessert that is  low in calories. Serve yourself small portions or tell the host that you only want a small amount. Stand or sit away from the snack table. Stay away from the kitchen or stay busy if you are near the food. Limit your alcohol intake.  At [employer] and Cafeterias: Cover most of your plate with lettuce and/or vegetables. Use a salad plate instead of a dinner plate. After eating, clear away your dishes before having coffee or tea.  Entertaining at Home: Explore low-fat, low-cholesterol cookbooks. Use single-serving foods like chicken breasts or hamburger patties. Prepare low-calorie appetizers and desserts.   Holidays: Keep tempting foods out of sight. Decorate the house without using food. Have low-calorie beverages and foods on hand for guests. Allow yourself one planned treat a day. Don't skip meals to save up for the holiday feast. Eat regular, planned meals.   Exercise Well  Make exercise a priority and a planned activity in the day. If possible, walk the  entire or part of the distance to work. Get an exercise buddy. Go for a walk with a colleague during one of your breaks, go to the gym, run or take a walk with a friend, walk in the mall with a shopping companion. Park at the end of the parking lot and walk to the store or office entrance. Always take the stairs all of the way or at least part of the way to your floor. If you have a desk job, walk around the office frequently. Do leg lifts while sitting at your desk. Do something outside on the weekends like going for a hike or a bike ride.   Have a Healthy Attitude  Make health your weight management priority. Be realistic. Have a goal to achieve a healthier you, not necessarily the lowest weight or ideal weight based on calculations or tables. Focus on a healthy eating style, not on dieting. Dieting usually lasts for a short amount of time and rarely produces long-term success. Think long term. You are developing new healthy  behaviors to follow next month, in a year and in a decade.    This information is for educational purposes only and is not intended to replace the advice of your doctor or health care provider. We encourage you to discuss with your doctor any questions or concerns you may have.        Guidelines for Losing Weight   We want weight loss that will last so you should lose 1-2 pounds a week.  THAT IS IT! Please pick THREE things a month to change. Once it is a habit check off the item. Then pick another three items off the list to become habits.  If you are already doing a habit on the list GREAT!  Cross that item off!  Dont drink your calories. Ie, alcohol, soda, fruit juice, and sweet tea.   Drink more water. Drink a glass when you feel hungry or before each meal.   Eat breakfast - Complex carb and protein (likeDannon light and fit yogurt, oatmeal, fruit, eggs, [place] bacon).  Measure your cereal.  Eat no more than one cup a day. (ie Kashi)  Eat an apple a day.  Add a vegetable a day.  Try a new vegetable a month.  Use Pam! Stop using oil or butter to cook.  Dont finish your plate or use smaller plates.  Share your dessert.  Eat sugar free Jello for dessert or frozen grapes.  Dont eat 2-3 hours before bed.  Switch to whole wheat bread, pasta, and brown rice.  Make healthier choices when you eat out. No fries!  Pick baked chicken, NOT fried.  Dont forget to SLOW DOWN when you eat. It is not going anywhere.   Take the stairs.  Park far away in the parking lot  Lift soup cans (or weights) for 10 minutes while watching TV.  Walk at work for 10 minutes during break.  Walk outside 1 time a week with your friend, kids, dog, or significant other.  Start a walking group at church.  Walk the mall as much as you can tolerate.   Keep a food diary.  Weigh yourself daily.  Walk for 15 minutes 3 days per week.  Cook at home more often and eat out less. If life  happens and you go back to old habits, it is okay.  Just start over. You can do it!  If you experience chest pain, get short of breath, or tired  during the exercise, please stop immediately and inform your doctor.    Before you even begin to attack a weight-loss plan, it pays to remember this: You are not fat. You have fat. Losing weight isn't about blame or shame; it's simply another achievement to accomplish. Dieting is like any other skill--you have to buckle down and work at it. As long as you act in a smart, reasonable way, you'll ultimately get where you want to be. Here are some weight loss pearls for you.   1. It's Not a Diet. It's a Lifestyle Thinking of a diet as something you're on and suffering through only for the short term doesn't work. To shed weight and keep it off, you need to make permanent changes to the way you eat. It's OK to indulge occasionally, of course, but if you cut calories temporarily and then revert to your old way of eating, you'll gain back the weight quicker than you can say yo-yo. Use it to lose it. Research shows that one of the best predictors of long-term weight loss is how many pounds you drop in the first month. For that reason, nutritionists often suggest being stricter for the first two weeks of your new eating strategy to build momentum. Cut out added sugar and alcohol and avoid unrefined carbs. After that, figure out how you can reincorporate them in a way that's healthy and maintainable.  2. There's a Right Way to Exercise Working out burns calories and fat and boosts your metabolism by building muscle. But those trying to lose weight are notorious for overestimating the number of calories they burn and underestimating the amount they take in. Unfortunately, your system is biologically programmed to hold on to extra pounds and that means when you start exercising, your body senses the deficit and ramps up its hunger signals. If you're not diligent, you'll eat  everything you burn and then some. Use it, to lose it. Cardio gets all the exercise glory, but strength and interval training are the real heroes. They help you build lean muscle, which in turn increases your metabolism and calorie-burning ability 3. Don't Overreact to Mild Hunger Some people have a hard time losing weight because of hunger anxiety. To them, being hungry is bad--something to be avoided at all costs--so they carry snacks with them and eat when they don't need to. Others eat because they're stressed out or bored. While you never want to get to the point of being ravenous (that's when bingeing is likely to happen), a hunger pang, a craving, or the fact that it's 3:00 p.m. should not send you racing for the vending machine or obsessing about the energy bar in your purse. Ideally, you should put off eating until your stomach is growling and it's difficult to concentrate.  Use it to lose it. When you feel the urge to eat, use the HALT method. Ask yourself, Am I really hungry? Or am I angry or anxious, lonely or bored, or tired? If you're still not certain, try the apple test. If you're truly hungry, an apple should seem delicious; if it doesn't, something else is going on. Or you can try drinking water and making yourself busy, if you are still hungry try a healthy snack.  4. Not All Calories Are Created Equal The mechanics of weight loss are pretty simple: Take in fewer calories than you use for energy. But the kind of food you eat makes all the difference. Processed food that's high in saturated fat and  refined starch or sugar can cause inflammation that disrupts the hormone signals that tell your brain you're full. The result: You eat a lot more.  Use it to lose it. Clean up your diet. Swap in whole, unprocessed foods, including vegetables, lean protein, and healthy fats that will fill you up and give you the biggest nutritional bang for your calorie buck. In a few weeks, as your brain starts  receiving regular hunger and fullness signals once again, you'll notice that you feel less hungry overall and naturally start cutting back on the amount you eat.  5. Protein, Produce, and Plant-Based Fats Are Your Weight-Loss Trinity Here's why eating the three Ps regularly will help you drop pounds. Protein fills you up. You need it to build lean muscle, which keeps your metabolism humming so that you can torch more fat. People in a weight-loss program who ate double the recommended daily allowance for protein (about 110 grams for a 150-pound woman) lost 70 percent of their weight from fat, while people who ate the RDA lost only about 40 percent, one study found. Produce is packed with filling fiber. "It's very difficult to consume too many calories if you're eating a lot of vegetables. Example: Three cups of broccoli is a lot of food, yet only 93 calories. (Fruit is another story. It can be easy to overeat and can contain a lot of calories from sugar, so be sure to monitor your intake.) Plant-based fats like olive oil and those in avocados and nuts are healthy and extra satiating.  Use it to lose it. Aim to incorporate each of the three Ps into every meal and snack. People who eat protein throughout the day are able to keep weight off, according to a study in the [place] of Clinical Nutrition. In addition to meat, poultry and seafood, good sources are beans, lentils, eggs, tofu, and yogurt. As for fat, keep portion sizes in check by measuring out salad dressing, oil, and nut butters (shoot for one to two tablespoons). Finally, eat veggies or a little fruit at every meal. People who did that consumed 308 fewer calories but didn't feel any hungrier than when they didn't eat more produce.  7. How You Eat Is As Important As What You Eat In order for your brain to register that you're full, you need to focus on what you're eating. Sit down whenever you eat, preferably at a table. Turn off the TV or  computer, put down your phone, and look at your food. Smell it. Chew slowly, and don't put another bite on your fork until you swallow. When women ate lunch this attentively, they consumed 30 percent less when snacking later than those who listened to an audiobook at lunchtime, according to a study in the [place] of Nutrition. 8. Weighing Yourself Really Works The scale provides the best evidence about whether your efforts are paying off. Seeing the numbers tick up or down or stagnate is motivation to keep going--or to rethink your approach. A 2015 study at [facility] found that daily weigh-ins helped people lose more weight, keep it off, and maintain that loss, even after two years. Use it to lose it. Step on the scale at the same time every day for the best results. If your weight shoots up several pounds from one weigh-in to the next, don't freak out. Eating a lot of salt the night before or having your period is the likely culprit. The number should return to normal in a  day or two. It's a steady climb that you need to do something about. 9. Too Much Stress and Too Little Sleep Are Your Enemies When you're tired and frazzled, your body cranks up the production of cortisol, the stress hormone that can cause carb cravings. Not getting enough sleep also boosts your levels of ghrelin, a hormone associated with hunger, while suppressing leptin, a hormone that signals fullness and satiety. People on a diet who slept only five and a half hours a night for two weeks lost 55 percent less fat and were hungrier than those who slept eight and a half hours, according to a study in the [place]. Use it to lose it. Prioritize sleep, aiming for seven hours or more a night, which research shows helps lower stress. And make sure you're getting quality zzz's. If a snoring spouse or a fidgety cat wakes you up frequently throughout the night, you may end up getting the equivalent of  just four hours of sleep, according to a study from [facility]. Keep pets out of the bedroom, and use a white-noise app to drown out snoring. 10. You Will Hit a plateau--And You Can Bust Through It As you slim down, your body releases much less leptin, the fullness hormone.  If you're not strength training, start right now. Building muscle can raise your metabolism to help you overcome a plateau. To keep your body challenged and burning calories, incorporate new moves and more intense intervals into your workouts or add another sweat session to your weekly routine. Alternatively, cut an extra 100 calories or so a day from your diet. Now that you've lost weight, your body simply doesn't need as much fuel.    Since food equals calories, in order to lose weight you must either eat fewer calories, exercise more to burn off calories with activity, or both. Food that is not used to fuel the body is stored as fat. A major component of losing weight is to make smarter food choices. Here's how:  1)   Limit non-nutritious foods, such as: Sugar, honey, syrups and candy Pastries, donuts, pies, cakes and cookies Soft drinks, sweetened juices and alcoholic beverages  2)  Cut down on high-fat foods by: - Choosing poultry, fish or lean red meat - Choosing low-fat cooking methods, such as baking, broiling, steaming, grilling and boiling - Using low-fat or non-fat dairy products - Using vinaigrette, herbs, lemon or fat-free salad dressings - Avoiding fatty meats, such as bacon, sausage, franks, ribs and luncheon meats - Avoiding high-fat snacks like nuts, chips and chocolate - Avoiding fried foods - Using less butter, margarine, oil and mayonnaise - Avoiding high-fat gravies, cream sauces and cream-based soups  3) Eat a variety of foods, including: - Fruit and vegetables that are raw, steamed or baked - Whole grains, breads, cereal, rice and pasta - Dairy products, such as low-fat or non-fat milk  or yogurt, low-fat cottage cheese and low-fat cheese - Protein-rich foods like chicken, [place], fish, lean meat and legumes, or beans  4) Change your eating habits by: - Eat three balanced meals a day to help control your hunger - Watch portion sizes and eat small servings of a variety of foods - Choose low-calorie snacks - Eat only when you are hungry and stop when you are satisfied - Eat slowly and try not to perform other tasks while eating - Find other activities to distract you from food, such as walking, taking up a hobby or being  involved in the community - Include regular exercise in your daily routine ( minimum of 20 min of moderate-intensity exercise at least 5 days/week)  - Find a support group, if necessary, for emotional support in your weight loss journey           Easy ways to cut 100 calories   1. Eat your eggs with hot sauce OR salsa instead of cheese.  Eggs are great for breakfast, but many people consider eggs and cheese to be BFFs. Instead of cheese--1 oz. of cheddar has 114 calories--top your eggs with hot sauce, which contains no calories and helps with satiety and metabolism. Salsa is also a great option!!  2. Top your toast, waffles or pancakes with fresh berries instead of jelly or syrup. Half a cup of berries--fresh, frozen or thawed--has about 40 calories, compared with 2 tbsp. of maple syrup or jelly, which both have about 100 calories. The berries will also give you a good punch of fiber, which helps keep you full and satisfied and wont spike blood sugar quickly like the jelly or syrup. 3. Swap the non-fat latte for black coffee with a splash of half-and-half. Contrary to its name, that non-fat latte has 130 calories and a startling 19g of carbohydrates per 16 oz. serving. Replacing that light drinkable dessert with a black coffee with a splash of half-and-half saves you more than 100 calories per 16 oz. serving. 4. Sprinkle salads with freeze-dried  raspberries instead of dried cranberries. If you want a sweet addition to your nutritious salad, stay away from dried cranberries. They have a whopping 130 calories per  cup and 30g carbohydrates. Instead, sprinkle freeze-dried raspberries guilt-free and save more than 100 calories per  cup serving, adding 3g of belly-filling fiber. 5. Go for mustard in place of mayo on your sandwich. Mustard can add really nice flavor to any sandwich, and there are tons of varieties, from spicy to honey. A serving of mayo is 95 calories, versus 10 calories in a serving of mustard.  Or try an avocado mayo spread: You can find the recipe few click this link: https://www.californiaavocado.com/recipes/recipe-container/[place]-avocado-mayo 6. Choose a DIY salad dressing instead of the store-bought kind. Mix Dijon or whole grain mustard with low-fat Kefir or red wine vinegar and garlic. 7. Use hummus as a spread instead of a dip. Use hummus as a spread on a high-fiber cracker or tortilla with a sandwich and save on calories without sacrificing taste. 8. Pick just one salad accessory. Salad isnt automatically a calorie winner. Its easy to over-accessorize with toppings. Instead of topping your salad with nuts, avocado and cranberries (all three will clock in at 313 calories), just pick one. The next day, choose a different accessory, which will also keep your salad interesting. You dont wear all your jewelry every day, right? 9. Ditch the white pasta in favor of spaghetti squash. One cup of cooked spaghetti squash has about 40 calories, compared with traditional spaghetti, which comes with more than 200. Spaghetti squash is also nutrient-dense. Its a good source of fiber and Vitamins A and C, and it can be eaten just like you would eat pasta--with a great tomato sauce and [place] meatballs or with pesto, tofu and spinach, for example. 10. Dress up your chili, soups and stews with non-fat [place] yogurt instead of  sour cream. Just a dollop of sour cream can set you back 115 calories and a whopping 12g of fat--seven of which are of the artery-clogging variety. Added bonus: [place]  yogurt is packed with muscle-building protein, calcium and B Vitamins. 11. Mash cauliflower instead of mashed potatoes. One cup of traditional mashed potatoes--in all their creamy goodness--has more than 200 calories, compared to mashed cauliflower, which you can typically eat for less than 100 calories per 1 cup serving. Cauliflower is a great source of the antioxidant indole-3-carbinol (I3C), which may help reduce the risk of some cancers, like breast cancer. 12. Ditch the ice cream sundae in favor of a [place] yogurt parfait. Instead of a cup of ice cream or fro-yo for dessert, try 1 cup of nonfat Greek yogurt topped with fresh berries and a sprinkle of cacao nibs. Both toppings are packed with antioxidants, which can help reduce cellular inflammation and oxidative damage. And the comparison is a no-brainer: One cup of ice cream has about 275 calories; one cup of frozen yogurt has about 230; and a cup of Greek yogurt has just 130, plus twice the protein, so youre less likely to return to the freezer for a second helping. 13. Put olive oil in a spray container instead of using it directly from the bottle. Each tablespoon of olive oil is 120 calories and 15g of fat. Use a mister instead of pouring it straight into the pan or onto a salad. This allows for portion control and will save you more than 100 calories. 14. When baking, substitute canned pumpkin for butter or oil. Canned pumpkin--not pumpkin pie mix--is loaded with Vitamin A, which is important for skin and eye health, as well as immunity. And the comparisons are pretty crazy:  cup of canned pumpkin has about 40 calories, compared to butter or oil, which has more than 800 calories. Yes, 800 calories. Applesauce and mashed banana can also serve as good substitutions for butter or  oil, usually in a 1:1 ratio. 15. Top casseroles with high-fiber cereal instead of breadcrumbs. Breadcrumbs are typically made with white bread, while breakfast cereals contain 5-9g of fiber per serving. Not only will you save more than 150 calories per  cup serving, the swap will also keep you more full and youll get a metabolism boost from the added fiber. 16. Snack on pistachios instead of macadamia nuts. Believe it or not, you get the same amount of calories from 35 pistachios (100 calories) as you would from only five macadamia nuts. 17. Chow down on kale chips rather than potato chips. This is my favorite dont knock it till you try it swap. Kale chips are so easy to make at home, and you can spice them up with a little grated parmesan or chili powder. Plus, theyre a mere fraction of the calories of potato chips, but with the same crunch factor we crave so often. 18. Add seltzer and some fruit slices to your cocktail instead of soda or fruit juice. One cup of soda or fruit juice can pack on as much as 140 calories. Instead, use seltzer and fruit slices. The fruit provides valuable phytochemicals, such as flavonoids and anthocyanins, which help to combat cancer and stave off the aging process.

## 2019-03-31 LAB — LIPID PANEL
Chol/HDL Ratio: 4.1 ratio (ref 0.0–4.4)
Cholesterol, Total: 189 mg/dL (ref 100–199)
HDL: 46 mg/dL (ref >39)
LDL Calculated: 95 mg/dL (ref 0–99)
Triglycerides: 242 mg/dL — ABNORMAL HIGH (ref 0–149)
VLDL Cholesterol Cal: 48 mg/dL — ABNORMAL HIGH (ref 5–40)

## 2019-03-31 LAB — VITAMIN D 25 HYDROXY (VIT D DEFICIENCY, FRACTURES): Vit D, 25-Hydroxy: 37.9 ng/mL (ref 30.0–100.0)

## 2019-03-31 LAB — HEMOGLOBIN A1C
Est. average glucose Bld gHb Est-mCnc: 117 mg/dL
Hgb A1c MFr Bld: 5.7 % — ABNORMAL HIGH (ref 4.8–5.6)

## 2019-04-02 ENCOUNTER — Other Ambulatory Visit: Payer: Self-pay | Admitting: Family Medicine

## 2019-04-02 DIAGNOSIS — E559 Vitamin D deficiency, unspecified: Secondary | ICD-10-CM

## 2019-04-12 ENCOUNTER — Telehealth: Payer: Self-pay | Admitting: Family Medicine

## 2019-04-12 NOTE — Telephone Encounter (Signed)
Called patient to set up provider suggested ED office visit for follow up on Arm pain --- Per patient she ultimate slept on arm & that's what caused the pain/ discomfort she was given flexeril in ED and it has resolved issue--- Pt decline OV. --FYI  To medical assistant.  --Dion Body

## 2019-04-17 ENCOUNTER — Encounter: Payer: Self-pay | Admitting: Family Medicine

## 2019-04-18 ENCOUNTER — Telehealth: Payer: Self-pay | Admitting: Family Medicine

## 2019-04-18 NOTE — Telephone Encounter (Signed)
Unsure of protocol sending to provider to advise if appointment needed. Future appointment is scheduled for 2021

## 2019-04-18 NOTE — Telephone Encounter (Signed)
Patient called states she get seasonal allergy this time of year &  Ask if OV necessary for Rx be called into pharmacy.  Forwarding request to medical asst to determine & advise.  --glh

## 2019-04-19 NOTE — Telephone Encounter (Signed)
Needs appt now for her concerns

## 2019-04-20 ENCOUNTER — Other Ambulatory Visit: Payer: Self-pay

## 2019-04-20 ENCOUNTER — Ambulatory Visit (INDEPENDENT_AMBULATORY_CARE_PROVIDER_SITE_OTHER): Payer: BC Managed Care – PPO | Admitting: Family Medicine

## 2019-04-20 ENCOUNTER — Encounter: Payer: Self-pay | Admitting: Family Medicine

## 2019-04-20 DIAGNOSIS — R0982 Postnasal drip: Secondary | ICD-10-CM

## 2019-04-20 DIAGNOSIS — R51 Headache: Secondary | ICD-10-CM

## 2019-04-20 DIAGNOSIS — J324 Chronic pansinusitis: Secondary | ICD-10-CM

## 2019-04-20 DIAGNOSIS — R519 Headache, unspecified: Secondary | ICD-10-CM | POA: Insufficient documentation

## 2019-04-20 DIAGNOSIS — J3089 Other allergic rhinitis: Secondary | ICD-10-CM | POA: Diagnosis not present

## 2019-04-20 DIAGNOSIS — Z8709 Personal history of other diseases of the respiratory system: Secondary | ICD-10-CM | POA: Diagnosis not present

## 2019-04-20 MED ORDER — LEVOCETIRIZINE DIHYDROCHLORIDE 5 MG PO TABS: 5.0000 mg | ORAL_TABLET | Freq: Every evening | ORAL | 0 refills | Status: DC

## 2019-04-20 MED ORDER — AZELASTINE HCL 0.1 % NA SOLN: NASAL | 11 refills | Status: DC

## 2019-04-20 MED ORDER — PREDNISONE 20 MG PO TABS: ORAL_TABLET | ORAL | 0 refills | Status: DC

## 2019-04-20 MED ORDER — SM SALINE SOLUTION SOLN: 0 refills | Status: DC

## 2019-04-20 MED ORDER — FLUTICASONE PROPIONATE 50 MCG/ACT NA SUSP: NASAL | 5 refills | Status: DC

## 2019-04-20 NOTE — Progress Notes (Signed)
Telehealth office visit note for Nicole Cooper, D.O- at [facility]   I connected with current patient today and verified that I am speaking with the correct person using two identifiers.   . Location of the patient: Home . Location of the provider: Office Only the patient (+/- their family members at pt's discretion) and myself were participating in the encounter    - This visit type was conducted due to national recommendations for restrictions regarding the COVID-19 Pandemic (e.g. social distancing) in an effort to limit this patient's exposure and mitigate transmission in our community.  This format is felt to be most appropriate for this patient at this time.   - The patient did not have access to video technology or had technical difficulties with video requiring transitioning to audio format only. - No physical exam could be performed with this format, beyond that communicated to [place] by the patient/ family members as noted.   - Additionally my office staff/ schedulers discussed with the patient that there may be a monetary charge related to this service, depending on their medical insurance.   The patient expressed understanding, and agreed to proceed.       History of Present Illness:  States the "ragweed's got me early already I guess."  Denies fever.  Notes her son was recently checked for COVID and came back negative.  "It's the same crud I get every year.  I live with it."  Says she doesn't think it's COVID and "doesn't feel bad," it's just her "normal."  Allergies & URI Symptoms   Notes "I didn't know if I could get some prednisone to knock it out."  Started a week ago, around last Wednesday or Thursday.  States she got stopped up; "both nostrils stopped up with sinus headaches over the weekend."  Says the sx make her feel like she has a chest cold now, and adds "once I get it in my chest, I'm done, it's there."  At night, she has been using her  inhaler for relief.  She does feel wheezy and short of breath in the evenings, stating "using inhaler at night so I can rest."  Adds "you get that feeling where you can't hardly breathe without that itch that makes you want to cough and cough and cough."  Says "I don't know if I feel tightness in my chest, just an uncomfortable feeling.  I feel this way every time I get this."  Says it "maybe feels 'heavy?'" and has trouble describing it.  Notes last year it took two months for her to get over these same symptoms, "all the way into December."  Has tried:  "I'm on Mucinex DM twice daily, the heavy dose the pharmacy said to use."  States she's taking Claritin and Singulair, but it's just "not enough to handle it this time of year."    GAD 7 : Generalized Anxiety Score 03/30/2019  Nervous, Anxious, on Edge 0  Control/stop worrying 0  Worry too much - different things 0  Trouble relaxing 0  Restless 0  Easily annoyed or irritable 0  Afraid - awful might happen 0  Total GAD 7 Score 0  Anxiety Difficulty Not difficult at all    Depression screen [facility] 2/9 03/30/2019 10/12/2018 03/16/2017  Decreased Interest 0 0 0  Down, Depressed, Hopeless 0 0 0  PHQ - 2 Score 0 0 0  Altered sleeping 0 0 -  Tired, decreased energy 0  1 -  Change in appetite 0 0 -  Feeling bad or failure about yourself  0 0 -  Trouble concentrating 0 0 -  Moving slowly or fidgety/restless 0 0 -  Suicidal thoughts 0 0 -  PHQ-9 Score 0 1 -  Difficult doing work/chores Not difficult at all Not difficult at all -        Impression and Recommendations:    1. Chronic pansinusitis   2. Sinus headache   3. Environmental and seasonal allergies   4. Hx of bronchitis   5. PND (post-nasal drip) w cough       - Discussed causes of patient's sx today. - Advised patient of the need to deal with her source of allergies first.  - Discontinue Claritin.  Xyzal prescribed.  See med list. - Continue Singulair.  - STRONGLY  advised patient to begin using AYR or Neilmed sinus rinses BID followed by prescription nasal sprays BID (one spray to each nostril).  Nasal sprays prescribed today.  - Steroids prescribed for short-term acute management, but patient understands she must continue her chronic allergy maintenance.  - Discussed prudent practices during COVID and that patient can get recommended testing for COVID at any time at certain sites.  - Advised patient that if she is not feeling wheezy, to try not to take her albuterol.  - Patient knows to call in to the clinic for concerns PRN.   - As part of my medical decision making, I reviewed the following data within the [place] History obtained from pt /family, CMA notes reviewed and incorporated if applicable, Labs reviewed, Radiograph/ tests reviewed if applicable and OV notes from prior OV's with me, as well as other specialists she/he has seen since seeing me last, were all reviewed and used in my medical decision making process today.   - Additionally, discussion had with patient regarding txmnt plan, and their biases/concerns about that plan were used in my medical decision making today.   - The patient agreed with the plan and demonstrated an understanding of the instructions.   No barriers to understanding were identified.   - Red flag symptoms and signs discussed in detail.  Patient expressed understanding regarding what to do in case of emergency\ urgent symptoms.  The patient was advised to call back or seek an in-person evaluation if the symptoms worsen or if the condition fails to improve as anticipated.   Return if symptoms worsen or fail to improve, for F-up of current med issues as previously d/c pt.    Meds ordered this encounter  Medications  . azelastine (ASTELIN) 0.1 % nasal spray    Sig: 1 spray each nostril twice daily after sinus rinses    Dispense:  30 mL    Refill:  11  . fluticasone (FLONASE) 50 MCG/ACT nasal spray     Sig: 1 spray each nostril following sinus rinses twice daily    Dispense:  16 g    Refill:  5  . levocetirizine (XYZAL) 5 MG tablet    Sig: Take 1 tablet (5 mg total) by mouth every evening.    Dispense:  90 tablet    Refill:  0  . predniSONE (DELTASONE) 20 MG tablet    Sig: Take 3 tabs po * 2 days, then 2 tabs for 2 d, then 1 tab 2 d, then 1/2 tab 2 days.    Dispense:  15 tablet    Refill:  0  . Soft Lens Products (  SM SALINE SOLUTION) SOLN    Sig: Sterile Saline 0.65% solution for nasal rinses BID    Dispense:  500 mL    Refill:  0    Medications Discontinued During This Encounter  Medication Reason  . fenofibrate 54 MG tablet Patient Preference  . fluticasone (FLONASE) 50 MCG/ACT nasal spray Patient Preference      Note:  This note was prepared with assistance of Dragon voice recognition software. Occasional wrong-word or sound-a-like substitutions may have occurred due to the inherent limitations of voice recognition software.  This document serves as a record of services personally performed by Nicole Dance, DO. It was created on her behalf by Toni Amend, a trained medical scribe. The creation of this record is based on the scribe's personal observations and the provider's statements to them.   I have reviewed the above medical documentation for accuracy and completeness and I concur.  Nicole Dance, DO 04/20/2019 1:14 PM        Patient Care Team    Relationship Specialty Notifications Start End  Nicole Dance, DO PCP - General Family Medicine  03/02/16   Louretta Shorten, MD Consulting Physician Obstetrics and Gynecology  03/30/19   Williams, [place] Physician Dermatology  03/30/19   Ronnette Juniper, MD Consulting Physician Gastroenterology  03/30/19    Comment: [facility]     -Vitals obtained; medications/ allergies reconciled;  personal medical, social, Sx etc.histories were updated by CMA, reviewed by me and are reflected in chart   Patient  Active Problem List   Diagnosis Date Noted  . Glucose intolerance (impaired glucose tolerance) 03/10/2016    Priority: High  . Obesity, Class III, BMI 40-49.9 (morbid obesity) ([place]) 03/03/2016    Priority: High  . HTN (hypertension)     Priority: High  . High triglycerides     Priority: High  . 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    Priority: Medium  . Sinus headache 04/20/2019    Priority: Medium  . GERD (gastroesophageal reflux disease) 03/03/2016    Priority: Medium  . Environmental and seasonal allergies 03/03/2016    Priority: Medium  . PND (post-nasal drip) with resultant cough     Priority: Medium  . Vitamin D insufficiency 03/16/2017    Priority: Low  . Emotional upset- due to her wt 03/16/2017    Priority: Low  . Metabolic syndrome [id number]    Priority: Low  . S/P hysterectomy with oophorectomy 03/03/2016    Priority: Low  . Plantar fasciitis of left foot 03/03/2016    Priority: Low  . Physical deconditioning 03/03/2016    Priority: Low  . Sinusitis, acute recurrent 03/10/2016  . Feared condition not demonstrated 03/03/2016  . Hx of bronchitis   . Dental crowns present   . Fluid retention   . h/o Hair loss   . History of urticaria   . Breathing problem   . Chest heaviness   . Dyspnea   . Carpal tunnel syndrome of right wrist 12/15/2012     Current Meds  Medication Sig  . albuterol (PROVENTIL HFA;VENTOLIN HFA) 108 (90 Base) MCG/ACT inhaler Inhale 2 puffs into the lungs every 6 (six) hours as needed for wheezing.  . hydrochlorothiazide (HYDRODIURIL) 25 MG tablet TAKE 1 TABLET BY MOUTH EVERY DAY  . montelukast (SINGULAIR) 10 MG tablet TAKE 1 TABLET BY MOUTH EVERYDAY AT BEDTIME  . omeprazole (PRILOSEC) 20 MG capsule TAKE 1 CAPSULE BY MOUTH EVERY DAY  . Probiotic Product (PROBIOTIC-10 PO) Take 1 tablet by mouth daily.  Marland Kitchen  UNABLE TO FIND Med Name: infrarosa  . Vitamin D, Ergocalciferol, (DRISDOL) 1.25 MG (50000 UT) CAPS capsule TAKE 1 CAPSULE (50,000 UNITS TOTAL)  BY MOUTH EVERY 7 (SEVEN) DAYS.     Allergies:  Allergies  Allergen Reactions  . Keflex [Cephalexin] Itching and Other (See Comments)    Yeast infection  . Percocet [Oxycodone-Acetaminophen] Itching  . Sulfa Antibiotics Rash     ROS:  See above HPI for pertinent positives and negatives   Objective:   There were no vitals taken for this visit.  (if some vitals are omitted, this means that patient was UNABLE to obtain them even though they were asked to get them prior to OV today.  They were asked to call us at their earliest convenience with these once obtained. )  General: A & O * 3; sounds in no acute distress; in usual state of health.  Skin: Pt confirms warm and dry extremities and pink fingertips HEENT: Pt confirms lips non-cyanotic Chest: Patient confirms normal chest excursion and movement Respiratory: speaking in full sentences, no conversational dyspnea; patient confirms no use of accessory muscles Psych: insight appears good, mood- appears full

## 2019-06-19 ENCOUNTER — Other Ambulatory Visit: Payer: Self-pay | Admitting: Obstetrics and Gynecology

## 2019-06-19 DIAGNOSIS — Z1231 Encounter for screening mammogram for malignant neoplasm of breast: Secondary | ICD-10-CM

## 2019-06-27 ENCOUNTER — Other Ambulatory Visit: Payer: Self-pay | Admitting: Family Medicine

## 2019-08-05 ENCOUNTER — Other Ambulatory Visit: Payer: Self-pay | Admitting: Family Medicine

## 2019-08-05 DIAGNOSIS — J3089 Other allergic rhinitis: Secondary | ICD-10-CM

## 2019-08-05 DIAGNOSIS — R519 Headache, unspecified: Secondary | ICD-10-CM

## 2019-08-05 DIAGNOSIS — J324 Chronic pansinusitis: Secondary | ICD-10-CM

## 2019-08-05 DIAGNOSIS — R0982 Postnasal drip: Secondary | ICD-10-CM

## 2019-08-06 DIAGNOSIS — R05 Cough: Secondary | ICD-10-CM | POA: Diagnosis not present

## 2019-08-06 DIAGNOSIS — Z20828 Contact with and (suspected) exposure to other viral communicable diseases: Secondary | ICD-10-CM | POA: Diagnosis not present

## 2019-08-08 ENCOUNTER — Ambulatory Visit: Payer: BC Managed Care – PPO

## 2019-08-08 DIAGNOSIS — U071 COVID-19: Secondary | ICD-10-CM | POA: Diagnosis not present

## 2019-08-08 DIAGNOSIS — R519 Headache, unspecified: Secondary | ICD-10-CM | POA: Diagnosis not present

## 2019-08-08 DIAGNOSIS — R0981 Nasal congestion: Secondary | ICD-10-CM | POA: Diagnosis not present

## 2019-08-08 DIAGNOSIS — R05 Cough: Secondary | ICD-10-CM | POA: Diagnosis not present

## 2019-08-09 ENCOUNTER — Ambulatory Visit: Payer: BC Managed Care – PPO | Admitting: Family Medicine

## 2019-09-18 ENCOUNTER — Other Ambulatory Visit: Payer: Self-pay | Admitting: Family Medicine

## 2019-09-18 DIAGNOSIS — E559 Vitamin D deficiency, unspecified: Secondary | ICD-10-CM

## 2019-09-22 ENCOUNTER — Other Ambulatory Visit: Payer: Self-pay

## 2019-09-22 ENCOUNTER — Ambulatory Visit
Admission: RE | Admit: 2019-09-22 | Discharge: 2019-09-22 | Disposition: A | Discharge status: Home / Self Care | Payer: BC Managed Care – PPO | Source: Ambulatory Visit | Attending: Obstetrics and Gynecology | Admitting: Obstetrics and Gynecology

## 2019-09-22 DIAGNOSIS — Z1231 Encounter for screening mammogram for malignant neoplasm of breast: Secondary | ICD-10-CM | POA: Diagnosis not present

## 2019-10-05 ENCOUNTER — Encounter: Payer: BC Managed Care – PPO | Admitting: Family Medicine

## 2019-11-24 ENCOUNTER — Ambulatory Visit (INDEPENDENT_AMBULATORY_CARE_PROVIDER_SITE_OTHER): Payer: BC Managed Care – PPO | Admitting: Family Medicine

## 2019-11-24 ENCOUNTER — Other Ambulatory Visit: Payer: Self-pay

## 2019-11-24 ENCOUNTER — Encounter: Payer: Self-pay | Admitting: Family Medicine

## 2019-11-24 VITALS — BP 144/82 | HR 82 | Temp 98.2°F | Resp 12 | Ht 64.5 in | Wt 270.7 lb

## 2019-11-24 DIAGNOSIS — R7302 Impaired glucose tolerance (oral): Secondary | ICD-10-CM

## 2019-11-24 DIAGNOSIS — Z0000 Encounter for general adult medical examination without abnormal findings: Secondary | ICD-10-CM | POA: Diagnosis not present

## 2019-11-24 DIAGNOSIS — I10 Essential (primary) hypertension: Secondary | ICD-10-CM

## 2019-11-24 DIAGNOSIS — E8881 Metabolic syndrome: Secondary | ICD-10-CM

## 2019-11-24 DIAGNOSIS — Z719 Counseling, unspecified: Secondary | ICD-10-CM

## 2019-11-24 DIAGNOSIS — E781 Pure hyperglyceridemia: Secondary | ICD-10-CM

## 2019-11-24 DIAGNOSIS — E559 Vitamin D deficiency, unspecified: Secondary | ICD-10-CM | POA: Diagnosis not present

## 2019-11-24 MED ORDER — VITAMIN D (ERGOCALCIFEROL) 1.25 MG (50000 UNIT) PO CAPS: 50000.0000 [IU] | ORAL_CAPSULE | ORAL | 0 refills | Status: DC

## 2019-11-24 NOTE — Progress Notes (Signed)
Female Physical   Impression and Recommendations:    1. Encounter for wellness examination   2. Obesity, Class III, BMI 40-49.9 (morbid obesity) ([place])   3. Health education/counseling   4. Essential hypertension   5. High triglycerides   6. Glucose intolerance (impaired glucose tolerance)   7. Metabolic syndrome   8. Vitamin D deficiency     1) Anticipatory Guidance: Discussed skin CA prevention and sunscreen when outside along with skin surveillance; eating a balanced and modest diet; physical activity at least 25 minutes per day or minimum of 150 min/ week moderate to intense activity.  2) Immunizations / Screenings / Labs:   All immunizations are up-to-date per recommendations or will be updated today if pt allows.    - Patient understands with dental and vision screens they will schedule independently.  - Will obtain CBC, CMP, HgA1c, Lipid panel, TSH and vit D when fasting, if not already done past 12 mo/ recently   - Last colonoscopy obtained February 16, 2019.  WNL with repeat recommended in 5 years.  - Last mammogram obtained 09/22/2019.  - Need for shingles vaccine.  Patient knows to obtain this vaccine at least 2-4 weeks after her final COVID-19 vaccine.  - Per patient, has obtained her first round of the COVID-19 vaccine, with the second round coming up around April 21.  - Discussed CDC recommendations for every sexually active adult to be screened once in their lifetime for Hep C/HIV.  3) Preventative Maintenance & Weight Goals - Body mass index is 45.75 kg/m: BMI meaning discussed with patient.  Discussed goal to improve diet habits to improve overall feelings of well being and objective health data. Improve nutrient density of diet through increasing intake of fruits and vegetables and decreasing saturated fats, white flour products and refined sugars.  - Discussed weight loss goals with patient today. If desired, patient knows she may request referral to assistance  programs such as Healthy Weight and Wellness.   -  Encouraged patient to seek the assistance of self-help books as desired, and focus on why she should prioritize herself and begin to engage in purposeful self-care.  - Encouraged patient to work on mindfulness and setting discrete health goals.  - Advised patient to continue working toward regular exercise to improve overall mental, physical, and emotional health.    - Reviewed the "spokes of the wheel" of mood and health management.  Stressed the importance of ongoing prudent habits, including regular exercise, appropriate sleep hygiene, healthful dietary habits, and prayer/meditation to relax.  - Encouraged patient to engage in daily physical activity as tolerated, especially a formal exercise routine.  Recommended that the patient eventually strive for at least 150 minutes of moderate cardiovascular activity per week according to guidelines established by the [facility].   - Healthy dietary habits encouraged, including foods low in sugar and refined carbohydrates, high in fiber and antioxidants, and food high in lean protein (including chicken, [place], fish).  - Patient should also consume adequate amounts of water.  - Health counseling performed.  All questions answered.   Recommendations - Patient will return for follow-up in 2 months with a blood pressure log to follow her poorly controlled blood pressure.   Meds ordered this encounter  Medications  . Vitamin D, Ergocalciferol, (DRISDOL) 1.25 MG (50000 UNIT) CAPS capsule    Sig: Take 1 capsule (50,000 Units total) by mouth every 7 (seven) days.    Dispense:  12 capsule    Refill:  0  Orders Placed This Encounter  Procedures  . CBC  . Comprehensive metabolic panel  . TSH  . T4, free  . Hemoglobin A1c  . VITAMIN D 25 Hydroxy (Vit-D Deficiency, Fractures)  . Lipid panel     Return for f/up in 2 months with blood pressure log for HTN.    Reminded pt important of f-up  preventative CPE in 1 year.  Reminded pt again, this is in addition to any chronic care visits.    Gross side effects, risk and benefits, and alternatives of medications discussed with patient.  Patient is aware that all medications have potential side effects and we are unable to predict every side effect or drug-drug interaction that may occur.  Expresses verbal understanding and consents to current therapy plan and treatment regimen.   Please see orders placed and AVS handed out to patient at the end of our visit for further patient instructions/ counseling done pertaining to today's office visit.   This case required medical decision making of at least moderate complexity.  This document serves as a record of services personally performed by Mellody Dance, DO. It was created on her behalf by Toni Amend, a trained medical scribe. The creation of this record is based on the scribe's personal observations and the provider's statements to them.    The above documentation from Toni Amend, medical scribe, has been reviewed by [name], D.O.   Subjective:    I, Toni Amend, am serving as Education administrator for [employer].   CPE HPI: Nicole Cooper is a 54 y.o. female who presents to [facility] today a yearly health maintenance exam.   Health Maintenance Summary  - Reviewed and updated, unless pt declines services.  Last Cologuard or Colonoscopy:   Had her colonoscopy February 16, 2019.  Normal with repeat recommended in 5 years.  Tobacco History Reviewed:  Y; never smoker.  Alcohol and/or drug use:  No concerns; no use Exercise Habits:  Not meeting AHA guidelines; says "I just run after grandkids."  She takes care of her granddaughter every day. Dental Home:  Follows up with Dr. Rica Mote about yearly. Eye exams:  She follows up with [facility] in [place], yearly. Dermatology home:  Follows up with dermatology yearly.  Female  Health:  Continues to follow up with Dr. Corinna Capra, but notes she missed her last appointment due to [place].  She is going to return for follow-up in May of 2021. PAP Smear - last known results:  Her last pap smear was done on 05/19/2018.  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 STD concerns:  None, monogamous. Birth control method:  S/p hysterectomy. Menses regular:  S/p hysterectomy. Lumps or breast concerns:  None; last mammogram obtained 09/22/2019. Breast Cancer Family History:  Her dad's mother had breast cancer, and notes "hers was weird, hers was a cancer on the outside of her body."  Notes she had COVID-51 in December and it was horrible.  Says "I'm so hungry lately, I'm just starving every day."  She has tried the [employer] with [employer] and notes "I do better sometimes if I eat less during the day, and eat my good meal for supper."  Says she's trying to cook better, and her husband has been supportive in her efforts.  Additional concerns beyond health maintenance issues:     Immunization History  Administered Date(s) Administered  . Tdap 07/15/2011  Health Maintenance  Topic Date Due  . INFLUENZA VACCINE  03/17/2020  . PAP SMEAR-Modifier  05/19/2021  . TETANUS/TDAP  07/14/2021  . MAMMOGRAM  09/21/2021  . COLONOSCOPY  02/22/2024  . HIV Screening  Completed     Wt Readings from Last 3 Encounters:  11/24/19 270 lb 11.2 oz (122.8 kg)  03/30/19 269 lb 4.8 oz (122.2 kg)  10/12/18 259 lb (117.5 kg)   BP Readings from Last 3 Encounters:  11/24/19 (!) 144/82  03/30/19 136/89  10/12/18 123/87   Pulse Readings from Last 3 Encounters:  11/24/19 82  03/30/19 85  10/12/18 87     Past Medical History:  Diagnosis Date  . Allergy   . Breathing problem   . 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 Chest heaviness   . Dental crowns present    x 2  . Dyspnea   . Fluid retention    takes HCTZ prn  . Hair loss   . High  triglycerides   . History of urticaria    COUGH  . HTN (hypertension)   . Hx of bronchitis   . Obesity   . Post-nasal drip    year-round      Past Surgical History:  Procedure Laterality Date  . CARPAL TUNNEL RELEASE Left 11/23/2012   Procedure: LEFT CARPAL TUNNEL RELEASE;  Surgeon: Wynonia Sours, MD;  Location: [facility];  Service: Orthopedics;  Laterality: Left;  . CARPAL TUNNEL RELEASE Right 12/21/2012   Procedure: CARPAL TUNNEL RELEASE;  Surgeon: Wynonia Sours, MD;  Location: [place];  Service: Orthopedics;  Laterality: Right;  . CESAREAN SECTION  12/21/2003  . DILATION AND CURETTAGE OF UTERUS  2003   x 2  . LAPAROSCOPIC ASSISTED VAGINAL HYSTERECTOMY  10/25/2007  . OVARIAN CYST REMOVAL     age 63  . POLYPS REMOVED FROM UTERINE    . SALPINGOOPHORECTOMY  2010  . VAGINAL HYSTERECTOMY    . WISDOM TOOTH EXTRACTION     as a teenager      Family History  Problem Relation Age of Onset  . Hyperlipidemia Father   . Cancer - Lung Mother   . Hypertension Mother   . Cancer Mother        lung  . Hyperlipidemia Brother   . Cancer Maternal Grandmother        breast  . Breast cancer Paternal Grandmother       Social History   Substance and Sexual Activity  Drug Use No  ,   Social History   Substance and Sexual Activity  Alcohol Use No  ,   Social History   Tobacco Use  Smoking Status Never Smoker  Smokeless Tobacco Never Used  ,   Social History   Substance and Sexual Activity  Sexual Activity Yes  . Birth control/protection: Surgical    Current Outpatient Medications on File Prior to Visit  Medication Sig Dispense Refill  . albuterol (PROVENTIL HFA;VENTOLIN HFA) 108 (90 Base) MCG/ACT inhaler Inhale 2 puffs into the lungs every 6 (six) hours as needed for wheezing. 2 Inhaler 0  . cetirizine (ZYRTEC) 10 MG chewable tablet Chew 10 mg by mouth daily.    . fluticasone (FLONASE) 50 MCG/ACT nasal spray 1 spray each nostril following  sinus rinses twice daily 16 g 5  . hydrochlorothiazide (HYDRODIURIL) 25 MG tablet TAKE 1 TABLET BY MOUTH EVERY DAY 90 tablet 0  . montelukast (SINGULAIR) 10 MG tablet TAKE 1 TABLET BY  MOUTH EVERYDAY AT BEDTIME 90 tablet 0  . omeprazole (PRILOSEC) 20 MG capsule TAKE 1 CAPSULE BY MOUTH EVERY DAY 90 capsule 0  . Probiotic Product (PROBIOTIC-10 PO) Take 1 tablet by mouth daily.    . Soft Lens Products (SM SALINE SOLUTION) SOLN Sterile Saline 0.65% solution for nasal rinses BID 500 mL 0  . UNABLE TO FIND Med Name: infrarosa    . azelastine (ASTELIN) 0.1 % nasal spray 1 spray each nostril twice daily after sinus rinses (Patient not taking: Reported on 11/24/2019) 30 mL 11  . levocetirizine (XYZAL) 5 MG tablet TAKE 1 TABLET BY MOUTH EVERY DAY IN THE EVENING (Patient not taking: Reported on 11/24/2019) 90 tablet 0  . predniSONE (DELTASONE) 20 MG tablet Take 3 tabs po * 2 days, then 2 tabs for 2 d, then 1 tab 2 d, then 1/2 tab 2 days. (Patient not taking: Reported on 11/24/2019) 15 tablet 0   No current facility-administered medications on file prior to visit.    Allergies: Keflex [cephalexin], Percocet [oxycodone-acetaminophen], and Sulfa antibiotics  Review of Systems: General:   Denies fever, chills, unexplained weight loss.  Optho/Auditory:   Denies visual changes, blurred vision/LOV Respiratory:   Denies SOB, DOE more than baseline levels.   Cardiovascular:   Denies chest pain, palpitations, new onset peripheral edema  Gastrointestinal:   Denies nausea, vomiting, diarrhea.  Genitourinary: Denies dysuria, freq/ urgency, flank pain or discharge from genitals.  Endocrine:     Denies hot or cold intolerance, polyuria, polydipsia. Musculoskeletal:   Denies unexplained myalgias, joint swelling, unexplained arthralgias, gait problems.  Skin:  Denies rash, suspicious lesions Neurological:     Denies dizziness, unexplained weakness, numbness  Psychiatric/Behavioral:   Denies mood changes, suicidal or  homicidal ideations, hallucinations    Objective:    Blood pressure (!) 144/82, pulse 82, temperature 98.2 F (36.8 C), temperature source Oral, resp. rate 12, height 5' 4.5" (1.638 m), weight 270 lb 11.2 oz (122.8 kg), SpO2 99 %. Body mass index is 45.75 kg/m. General Appearance:    Alert, cooperative, no distress, appears stated age  Head:    Normocephalic, without obvious abnormality, atraumatic  Eyes:    PERRL, conjunctiva/corneas clear, EOM's intact, fundi    benign, both eyes  Ears:    Normal TM's and external ear canals, both ears  Nose:   Nares normal, septum midline, mucosa normal, no drainage    or sinus tenderness  Throat:   Lips w/o lesion, mucosa moist, and tongue normal; teeth and   gums normal  Neck:   Supple, symmetrical, trachea midline, no adenopathy;    thyroid:  no enlargement/tenderness/nodules; no carotid   bruit or JVD  Back:     Symmetric, no curvature, ROM normal, no CVA tenderness  Lungs:     Clear to auscultation bilaterally, respirations unlabored, no       Wh/ R/ R  Chest Wall:    No tenderness or gross deformity; normal excursion   Heart:    Regular rate and rhythm, S1 and S2 normal, no murmur, rub   or gallop  Breast Exam:    Deferred to OBGYN.  Abdomen:     Soft, non-tender, bowel sounds active all four quadrants, NO   G/R/R, no masses, no organomegaly  Genitalia:    Deferred to OBGYN.  Rectal:    Deferred.  Patient recently obtained colonoscopy in 7 of 2020.  Extremities:   Extremities normal, atraumatic, no cyanosis or gross edema  Pulses:   2+ and  symmetric all extremities  Skin:   Warm, dry, Skin color, texture, turgor normal, no obvious rashes or lesions Psych: No HI/SI, judgement and insight good, Euthymic mood. Full Affect.  Neurologic:   CNII-XII intact, normal strength, sensation and reflexes    Throughout

## 2019-11-24 NOTE — Patient Instructions (Addendum)
Please call 2-4 weeks after your last COVID vaccine to schedule your Shingrix vaccine.      Preventive Care for Adults, Female  A healthy lifestyle and preventive care can promote health and wellness. Preventive health guidelines for women include the following key practices.   A routine yearly physical is a good way to check with your health care provider about your health and preventive screening. It is a chance to share any concerns and updates on your health and to receive a thorough exam.   Visit your dentist for a routine exam and preventive care every 6 months. Brush your teeth twice a day and floss once a day. Good oral hygiene prevents tooth decay and gum disease.   The frequency of eye exams is based on your age, health, family medical history, use of contact lenses, and other factors. Follow your health care provider's recommendations for frequency of eye exams.   Eat a healthy diet. Foods like vegetables, fruits, whole grains, low-fat dairy products, and lean protein foods contain the nutrients you need without too many calories. Decrease your intake of foods high in solid fats, added sugars, and salt. Eat the right amount of calories for you.Get information about a proper diet from your health care provider, if necessary.   Regular physical exercise is one of the most important things you can do for your health. Most adults should get at least 150 minutes of moderate-intensity exercise (any activity that increases your heart rate and causes you to sweat) each week. In addition, most adults need muscle-strengthening exercises on 2 or more days a week.   Maintain a healthy weight. The body mass index (BMI) is a screening tool to identify possible weight problems. It provides an estimate of body fat based on height and weight. Your health care provider can find your BMI, and can help you achieve or maintain a healthy weight.For adults 20 years and older:   - A BMI below 18.5 is  considered underweight.   - A BMI of 18.5 to 24.9 is normal.   - A BMI of 25 to 29.9 is considered overweight.   - A BMI of 30 and above is considered obese.   Maintain normal blood lipids and cholesterol levels by exercising and minimizing your intake of trans and saturated fats.  Eat a balanced diet with plenty of fruit and vegetables. Blood tests for lipids and cholesterol should begin at age 60 and be repeated every 5 years minimum.  If your lipid or cholesterol levels are high, you are over 40, or you are at high risk for heart disease, you may need your cholesterol levels checked more frequently.Ongoing high lipid and cholesterol levels should be treated with medicines if diet and exercise are not working.   If you smoke, find out from your health care provider how to quit. If you do not use tobacco, do not start.   Lung cancer screening is recommended for adults aged 36-80 years who are at high risk for developing lung cancer because of a history of smoking. A yearly low-dose CT scan of the lungs is recommended for people who have at least a 30-pack-year history of smoking and are a current smoker or have quit within the past 15 years. A pack year of smoking is smoking an average of 1 pack of cigarettes a day for 1 year (for example: 1 pack a day for 30 years or 2 packs a day for 15 years). Yearly screening should continue until the  smoker has stopped smoking for at least 15 years. Yearly screening should be stopped for people who develop a health problem that would prevent them from having lung cancer treatment.   If you are pregnant, do not drink alcohol. If you are breastfeeding, be very cautious about drinking alcohol. If you are not pregnant and choose to drink alcohol, do not have more than 1 drink per day. One drink is considered to be 12 ounces (355 mL) of beer, 5 ounces (148 mL) of wine, or 1.5 ounces (44 mL) of liquor.   Avoid use of street drugs. Do not share needles with anyone.  Ask for help if you need support or instructions about stopping the use of drugs.   High blood pressure causes heart disease and increases the risk of stroke. Your blood pressure should be checked at least yearly.  Ongoing high blood pressure should be treated with medicines if weight loss and exercise do not work.   If you are 23-11 years old, ask your health care provider if you should take aspirin to prevent strokes.   Diabetes screening involves taking a blood sample to check your fasting blood sugar level. This should be done once every 3 years, after age 53, if you are within normal weight and without risk factors for diabetes. Testing should be considered at a younger age or be carried out more frequently if you are overweight and have at least 1 risk factor for diabetes.   Breast cancer screening is essential preventive care for women. You should practice "breast self-awareness."  This means understanding the normal appearance and feel of your breasts and may include breast self-examination.  Any changes detected, no matter how small, should be reported to a health care provider.  Women in their 38s and 30s should have a clinical breast exam (CBE) by a health care provider as part of a regular health exam every 1 to 3 years.  After age 42, women should have a CBE every year.  Starting at age 50, women should consider having a mammogram (breast X-ray test) every year.  Women who have a family history of breast cancer should talk to their health care provider about genetic screening.  Women at a high risk of breast cancer should talk to their health care providers about having an MRI and a mammogram every year.   -Breast cancer gene (BRCA)-related cancer risk assessment is recommended for women who have family members with BRCA-related cancers. BRCA-related cancers include breast, ovarian, tubal, and peritoneal cancers. Having family members with these cancers may be associated with an increased  risk for harmful changes (mutations) in the breast cancer genes BRCA1 and BRCA2. Results of the assessment will determine the need for genetic counseling and BRCA1 and BRCA2 testing.   The Pap test is a screening test for cervical cancer. A Pap test can show cell changes on the cervix that might become cervical cancer if left untreated. A Pap test is a procedure in which cells are obtained and examined from the lower end of the uterus (cervix).   - Women should have a Pap test starting at age 36.   - Between ages 60 and 60, Pap tests should be repeated every 2 years.   - Beginning at age 79, you should have a Pap test every 3 years as long as the past 3 Pap tests have been normal.   - Some women have medical problems that increase the chance of getting cervical cancer. Talk to your  health care provider about these problems. It is especially important to talk to your health care provider if a new problem develops soon after your last Pap test. In these cases, your health care provider may recommend more frequent screening and Pap tests.   - The above recommendations are the same for women who have or have not gotten the vaccine for human papillomavirus (HPV).   - If you had a hysterectomy for a problem that was not cancer or a condition that could lead to cancer, then you no longer need Pap tests. Even if you no longer need a Pap test, a regular exam is a good idea to make sure no other problems are starting.   - If you are between ages 39 and 66 years, and you have had normal Pap tests going back 10 years, you no longer need Pap tests. Even if you no longer need a Pap test, a regular exam is a good idea to make sure no other problems are starting.   - If you have had past treatment for cervical cancer or a condition that could lead to cancer, you need Pap tests and screening for cancer for at least 20 years after your treatment.   - If Pap tests have been discontinued, risk factors (such as a  new sexual partner) need to be reassessed to determine if screening should be resumed.   - The HPV test is an additional test that may be used for cervical cancer screening. The HPV test looks for the virus that can cause the cell changes on the cervix. The cells collected during the Pap test can be tested for HPV. The HPV test could be used to screen women aged 20 years and older, and should be used in women of any age who have unclear Pap test results. After the age of 37, women should have HPV testing at the same frequency as a Pap test.   Colorectal cancer can be detected and often prevented. Most routine colorectal cancer screening begins at the age of 89 years and continues through age 85 years. However, your health care provider may recommend screening at an earlier age if you have risk factors for colon cancer. On a yearly basis, your health care provider may provide home test kits to check for hidden blood in the stool.  Use of a small camera at the end of a tube, to directly examine the colon (sigmoidoscopy or colonoscopy), can detect the earliest forms of colorectal cancer. Talk to your health care provider about this at age 8, when routine screening begins. Direct exam of the colon should be repeated every 5 -10 years through age 63 years, unless early forms of pre-cancerous polyps or small growths are found.   People who are at an increased risk for hepatitis B should be screened for this virus. You are considered at high risk for hepatitis B if:  -You were born in a country where hepatitis B occurs often. Talk with your health care provider about which countries are considered high risk.  - Your parents were born in a high-risk country and you have not received a shot to protect against hepatitis B (hepatitis B vaccine).  - You have HIV or AIDS.  - You use needles to inject street drugs.  - You live with, or have sex with, someone who has Hepatitis B.  - You get hemodialysis  treatment.  - You take certain medicines for conditions like cancer, organ transplantation, and autoimmune conditions.  Hepatitis C blood testing is recommended for all people born from [birth year] through 1964 and any individual with known risks for hepatitis C.   Practice safe sex. Use condoms and avoid high-risk sexual practices to reduce the spread of sexually transmitted infections (STIs). STIs include gonorrhea, chlamydia, syphilis, trichomonas, herpes, HPV, and human immunodeficiency virus (HIV). Herpes, HIV, and HPV are viral illnesses that have no cure. They can result in disability, cancer, and death. Sexually active women aged 45 years and younger should be checked for chlamydia. Older women with new or multiple partners should also be tested for chlamydia. Testing for other STIs is recommended if you are sexually active and at increased risk.   Osteoporosis is a disease in which the bones lose minerals and strength with aging. This can result in serious bone fractures or breaks. The risk of osteoporosis can be identified using a bone density scan. Women ages 65 years and over and women at risk for fractures or osteoporosis should discuss screening with their health care providers. Ask your health care provider whether you should take a calcium supplement or vitamin D to There are also several preventive steps women can take to avoid osteoporosis and resulting fractures or to keep osteoporosis from worsening. -->Recommendations include:  Eat a balanced diet high in fruits, vegetables, calcium, and vitamins.  Get enough calcium. The recommended total intake of is 1,200 mg daily; for best absorption, if taking supplements, divide doses into 250-500 mg doses throughout the day. Of the two types of calcium, calcium carbonate is best absorbed when taken with food but calcium citrate can be taken on an empty stomach.  Get enough vitamin D. [employer] and the [employer] recommend at  least 1,000 IU per day for women age 64 and over who are at risk of vitamin D deficiency. Vitamin D deficiency can be caused by inadequate sun exposure (for example, those who live in [place]).  Avoid alcohol and smoking. Heavy alcohol intake (more than 7 drinks per week) increases the risk of falls and hip fracture and women smokers tend to lose bone more rapidly and have lower bone mass than nonsmokers. Stopping smoking is one of the most important changes women can make to improve their health and decrease risk for disease.  Be physically active every day. Weight-bearing exercise (for example, fast walking, hiking, jogging, and weight training) may strengthen bones or slow the rate of bone loss that comes with aging. Balancing and muscle-strengthening exercises can reduce the risk of falling and fracture.  Consider therapeutic medications. Currently, several types of effective drugs are available. Healthcare providers can recommend the type most appropriate for each woman.  Eliminate environmental factors that may contribute to accidents. Falls cause nearly 90% of all osteoporotic fractures, so reducing this risk is an important bone-health strategy. Measures include ample lighting, removing obstructions to walking, using nonskid rugs on floors, and placing mats and/or grab bars in showers.  Be aware of medication side effects. Some common medicines make bones weaker. These include a type of steroid drug called glucocorticoids used for arthritis and asthma, some antiseizure drugs, certain sleeping pills, treatments for endometriosis, and some cancer drugs. An overactive thyroid gland or using too much thyroid hormone for an underactive thyroid can also be a problem. If you are taking these medicines, talk to your doctor about what you can do to help protect your bones.reduce the rate of osteoporosis.    Menopause can be associated with physical symptoms and risks. Hormone replacement  therapy is available to decrease symptoms and risks. You should talk to your health care provider about whether hormone replacement therapy is right for you.   Use sunscreen. Apply sunscreen liberally and repeatedly throughout the day. You should seek shade when your shadow is shorter than you. Protect yourself by wearing long sleeves, pants, a wide-brimmed hat, and sunglasses year round, whenever you are outdoors.   Once a month, do a whole body skin exam, using a mirror to look at the skin on your back. Tell your health care provider of new moles, moles that have irregular borders, moles that are larger than a pencil eraser, or moles that have changed in shape or color.   -Stay current with required vaccines (immunizations).   Influenza vaccine. All adults should be immunized every year.  Tetanus, diphtheria, and acellular pertussis (Td, Tdap) vaccine. Pregnant women should receive 1 dose of Tdap vaccine during each pregnancy. The dose should be obtained regardless of the length of time since the last dose. Immunization is preferred during the 27th 36th week of gestation. An adult who has not previously received Tdap or who does not know her vaccine status should receive 1 dose of Tdap. This initial dose should be followed by tetanus and diphtheria toxoids (Td) booster doses every 10 years. Adults with an unknown or incomplete history of completing a 3-dose immunization series with Td-containing vaccines should begin or complete a primary immunization series including a Tdap dose. Adults should receive a Td booster every 10 years.  Varicella vaccine. An adult without evidence of immunity to varicella should receive 2 doses or a second dose if she has previously received 1 dose. Pregnant females who do not have evidence of immunity should receive the first dose after pregnancy. This first dose should be obtained before leaving the health care facility. The second dose should be obtained 4 8 weeks  after the first dose.  Human papillomavirus (HPV) vaccine. Females aged 42 26 years who have not received the vaccine previously should obtain the 3-dose series. The vaccine is not recommended for use in pregnant females. However, pregnancy testing is not needed before receiving a dose. If a female is found to be pregnant after receiving a dose, no treatment is needed. In that case, the remaining doses should be delayed until after the pregnancy. Immunization is recommended for any person with an immunocompromised condition through the age of 71 years if she did not get any or all doses earlier. During the 3-dose series, the second dose should be obtained 4 8 weeks after the first dose. The third dose should be obtained 24 weeks after the first dose and 16 weeks after the second dose.  Zoster vaccine. One dose is recommended for adults aged 64 years or older unless certain conditions are present.  Measles, mumps, and rubella (MMR) vaccine. Adults born before [birth year] generally are considered immune to measles and mumps. Adults born in [birth year] or later should have 1 or more doses of MMR vaccine unless there is a contraindication to the vaccine or there is laboratory evidence of immunity to each of the three diseases. A routine second dose of MMR vaccine should be obtained at least 28 days after the first dose for students attending postsecondary schools, health care workers, or international travelers. People who received inactivated measles vaccine or an unknown type of measles vaccine during 1963 1967 should receive 2 doses of MMR vaccine. People who received inactivated mumps vaccine or an unknown type of mumps vaccine before  1979 and are at high risk for mumps infection should consider immunization with 2 doses of MMR vaccine. For females of childbearing age, rubella immunity should be determined. If there is no evidence of immunity, females who are not pregnant should be vaccinated. If there is no evidence of  immunity, females who are pregnant should delay immunization until after pregnancy. Unvaccinated health care workers born before [birth year] who lack laboratory evidence of measles, mumps, or rubella immunity or laboratory confirmation of disease should consider measles and mumps immunization with 2 doses of MMR vaccine or rubella immunization with 1 dose of MMR vaccine.  Pneumococcal 13-valent conjugate (PCV13) vaccine. When indicated, a person who is uncertain of her immunization history and has no record of immunization should receive the PCV13 vaccine. An adult aged 41 years or older who has certain medical conditions and has not been previously immunized should receive 1 dose of PCV13 vaccine. This PCV13 should be followed with a dose of pneumococcal polysaccharide (PPSV23) vaccine. The PPSV23 vaccine dose should be obtained at least 8 weeks after the dose of PCV13 vaccine. An adult aged 47 years or older who has certain medical conditions and previously received 1 or more doses of PPSV23 vaccine should receive 1 dose of PCV13. The PCV13 vaccine dose should be obtained 1 or more years after the last PPSV23 vaccine dose.  Pneumococcal polysaccharide (PPSV23) vaccine. When PCV13 is also indicated, PCV13 should be obtained first. All adults aged 49 years and older should be immunized. An adult younger than age 98 years who has certain medical conditions should be immunized. Any person who resides in a nursing home or long-term care facility should be immunized. An adult smoker should be immunized. People with an immunocompromised condition and certain other conditions should receive both PCV13 and PPSV23 vaccines. People with human immunodeficiency virus (HIV) infection should be immunized as soon as possible after diagnosis. Immunization during chemotherapy or radiation therapy should be avoided. Routine use of PPSV23 vaccine is not recommended for American Indians, [place] Natives, or people younger than 65 years  unless there are medical conditions that require PPSV23 vaccine. When indicated, people who have unknown immunization and have no record of immunization should receive PPSV23 vaccine. One-time revaccination 5 years after the first dose of PPSV23 is recommended for people aged 35 64 years who have chronic kidney failure, nephrotic syndrome, asplenia, or immunocompromised conditions. People who received 1 2 doses of PPSV23 before age 51 years should receive another dose of PPSV23 vaccine at age 41 years or later if at least 5 years have passed since the previous dose. Doses of PPSV23 are not needed for people immunized with PPSV23 at or after age 19 years.  Meningococcal vaccine. Adults with asplenia or persistent complement component deficiencies should receive 2 doses of quadrivalent meningococcal conjugate (MenACWY-D) vaccine. The doses should be obtained at least 2 months apart. Microbiologists working with certain meningococcal bacteria, [place] recruits, people at risk during an outbreak, and people who travel to or live in countries with a high rate of meningitis should be immunized. A first-year college student up through age 22 years who is living in a residence hall should receive a dose if she did not receive a dose on or after her 16th birthday. Adults who have certain high-risk conditions should receive one or more doses of vaccine.  Hepatitis A vaccine. Adults who wish to be protected from this disease, have certain high-risk conditions, work with hepatitis A-infected animals, work in hepatitis A research labs,  or travel to or work in countries with a high rate of hepatitis A should be immunized. Adults who were previously unvaccinated and who anticipate close contact with an international adoptee during the first 60 days after arrival in the [place] from a country with a high rate of hepatitis A should be immunized.  Hepatitis B vaccine.  Adults who wish to be protected from this disease,  have certain high-risk conditions, may be exposed to blood or other infectious body fluids, are household contacts or sex partners of hepatitis B positive people, are clients or workers in certain care facilities, or travel to or work in countries with a high rate of hepatitis B should be immunized.  Haemophilus influenzae type b (Hib) vaccine. A previously unvaccinated person with asplenia or sickle cell disease or having a scheduled splenectomy should receive 1 dose of Hib vaccine. Regardless of previous immunization, a recipient of a hematopoietic stem cell transplant should receive a 3-dose series 6 12 months after her successful transplant. Hib vaccine is not recommended for adults with HIV infection.  Preventive Services / Frequency Ages 30 to 39years  Blood pressure check.** / Every 1 to 2 years.  Lipid and cholesterol check.** / Every 5 years beginning at age 63.  Clinical breast exam.** / Every 3 years for women in their 73s and 22s.  BRCA-related cancer risk assessment.** / For women who have family members with a BRCA-related cancer (breast, ovarian, tubal, or peritoneal cancers).  Pap test.** / Every 2 years from ages 35 through 15. Every 3 years starting at age 60 through age 66 or 103 with a history of 3 consecutive normal Pap tests.  HPV screening.** / Every 3 years from ages 51 through ages 54 to 69 with a history of 3 consecutive normal Pap tests.  Hepatitis C blood test.** / For any individual with known risks for hepatitis C.  Skin self-exam. / Monthly.  Influenza vaccine. / Every year.  Tetanus, diphtheria, and acellular pertussis (Tdap, Td) vaccine.** / Consult your health care provider. Pregnant women should receive 1 dose of Tdap vaccine during each pregnancy. 1 dose of Td every 10 years.  Varicella vaccine.** / Consult your health care provider. Pregnant females who do not have evidence of immunity should receive the first dose after pregnancy.  HPV vaccine. / 3  doses over 6 months, if 61 and younger. The vaccine is not recommended for use in pregnant females. However, pregnancy testing is not needed before receiving a dose.  Measles, mumps, rubella (MMR) vaccine.** / You need at least 1 dose of MMR if you were born in 1957 or later. You may also need a 2nd dose. For females of childbearing age, rubella immunity should be determined. If there is no evidence of immunity, females who are not pregnant should be vaccinated. If there is no evidence of immunity, females who are pregnant should delay immunization until after pregnancy.  Pneumococcal 13-valent conjugate (PCV13) vaccine.** / Consult your health care provider.  Pneumococcal polysaccharide (PPSV23) vaccine.** / 1 to 2 doses if you smoke cigarettes or if you have certain conditions.  Meningococcal vaccine.** / 1 dose if you are age 72 to 73 years and a Market researcher living in a residence hall, or have one of several medical conditions, you need to get vaccinated against meningococcal disease. You may also need additional booster doses.  Hepatitis A vaccine.** / Consult your health care provider.  Hepatitis B vaccine.** / Consult your health care provider.  Haemophilus  influenzae type b (Hib) vaccine.** / Consult your health care provider.  Ages 53 to 64years  Blood pressure check.** / Every 1 to 2 years.  Lipid and cholesterol check.** / Every 5 years beginning at age 43 years.  Lung cancer screening. / Every year if you are aged 36 80 years and have a 30-pack-year history of smoking and currently smoke or have quit within the past 15 years. Yearly screening is stopped once you have quit smoking for at least 15 years or develop a health problem that would prevent you from having lung cancer treatment.  Clinical breast exam.** / Every year after age 33 years.  BRCA-related cancer risk assessment.** / For women who have family members with a BRCA-related cancer (breast, ovarian,  tubal, or peritoneal cancers).  Mammogram.** / Every year beginning at age 53 years and continuing for as long as you are in good health. Consult with your health care provider.  Pap test.** / Every 3 years starting at age 44 years through age 13 or 32 years with a history of 3 consecutive normal Pap tests.  HPV screening.** / Every 3 years from ages 100 years through ages 33 to 26 years with a history of 3 consecutive normal Pap tests.  Fecal occult blood test (FOBT) of stool. / Every year beginning at age 69 years and continuing until age 74 years. You may not need to do this test if you get a colonoscopy every 10 years.  Flexible sigmoidoscopy or colonoscopy.** / Every 5 years for a flexible sigmoidoscopy or every 10 years for a colonoscopy beginning at age 87 years and continuing until age 33 years.  Hepatitis C blood test.** / For all people born from [birth year] through 1964 and any individual with known risks for hepatitis C.  Skin self-exam. / Monthly.  Influenza vaccine. / Every year.  Tetanus, diphtheria, and acellular pertussis (Tdap/Td) vaccine.** / Consult your health care provider. Pregnant women should receive 1 dose of Tdap vaccine during each pregnancy. 1 dose of Td every 10 years.  Varicella vaccine.** / Consult your health care provider. Pregnant females who do not have evidence of immunity should receive the first dose after pregnancy.  Zoster vaccine.** / 1 dose for adults aged 3 years or older.  Measles, mumps, rubella (MMR) vaccine.** / You need at least 1 dose of MMR if you were born in 1957 or later. You may also need a 2nd dose. For females of childbearing age, rubella immunity should be determined. If there is no evidence of immunity, females who are not pregnant should be vaccinated. If there is no evidence of immunity, females who are pregnant should delay immunization until after pregnancy.  Pneumococcal 13-valent conjugate (PCV13) vaccine.** / Consult your health  care provider.  Pneumococcal polysaccharide (PPSV23) vaccine.** / 1 to 2 doses if you smoke cigarettes or if you have certain conditions.  Meningococcal vaccine.** / Consult your health care provider.  Hepatitis A vaccine.** / Consult your health care provider.  Hepatitis B vaccine.** / Consult your health care provider.  Haemophilus influenzae type b (Hib) vaccine.** / Consult your health care provider.  Ages 72 years and over  Blood pressure check.** / Every 1 to 2 years.  Lipid and cholesterol check.** / Every 5 years beginning at age 49 years.  Lung cancer screening. / Every year if you are aged 67 80 years and have a 30-pack-year history of smoking and currently smoke or have quit within the past 15 years. Yearly screening is  stopped once you have quit smoking for at least 15 years or develop a health problem that would prevent you from having lung cancer treatment.  Clinical breast exam.** / Every year after age 63 years.  BRCA-related cancer risk assessment.** / For women who have family members with a BRCA-related cancer (breast, ovarian, tubal, or peritoneal cancers).  Mammogram.** / Every year beginning at age 57 years and continuing for as long as you are in good health. Consult with your health care provider.  Pap test.** / Every 3 years starting at age 3 years through age 45 or 65 years with 3 consecutive normal Pap tests. Testing can be stopped between 65 and 70 years with 3 consecutive normal Pap tests and no abnormal Pap or HPV tests in the past 10 years.  HPV screening.** / Every 3 years from ages 35 years through ages 17 or 2 years with a history of 3 consecutive normal Pap tests. Testing can be stopped between 65 and 70 years with 3 consecutive normal Pap tests and no abnormal Pap or HPV tests in the past 10 years.  Fecal occult blood test (FOBT) of stool. / Every year beginning at age 34 years and continuing until age 33 years. You may not need to do this test if  you get a colonoscopy every 10 years.  Flexible sigmoidoscopy or colonoscopy.** / Every 5 years for a flexible sigmoidoscopy or every 10 years for a colonoscopy beginning at age 32 years and continuing until age 76 years.  Hepatitis C blood test.** / For all people born from [birth year] through 1964 and any individual with known risks for hepatitis C.  Osteoporosis screening.** / A one-time screening for women ages 39 years and over and women at risk for fractures or osteoporosis.  Skin self-exam. / Monthly.  Influenza vaccine. / Every year.  Tetanus, diphtheria, and acellular pertussis (Tdap/Td) vaccine.** / 1 dose of Td every 10 years.  Varicella vaccine.** / Consult your health care provider.  Zoster vaccine.** / 1 dose for adults aged 93 years or older.  Pneumococcal 13-valent conjugate (PCV13) vaccine.** / Consult your health care provider.  Pneumococcal polysaccharide (PPSV23) vaccine.** / 1 dose for all adults aged 20 years and older.  Meningococcal vaccine.** / Consult your health care provider.  Hepatitis A vaccine.** / Consult your health care provider.  Hepatitis B vaccine.** / Consult your health care provider.  Haemophilus influenzae type b (Hib) vaccine.** / Consult your health care provider. ** Family history and personal history of risk and conditions may change your health care provider's recommendations. Document Released: 09/29/2001 Document Revised: 05/24/2013  [facility] Patient Information 2014 [place], [place].   EXERCISE AND DIET:  We recommended that you start or continue a regular exercise program for good health. Regular exercise means any activity that makes your heart beat faster and makes you sweat.  We recommend exercising at least 30 minutes per day at least 3 days a week, preferably 5.  We also recommend a diet low in fat and sugar / carbohydrates.  Inactivity, poor dietary choices and obesity can cause diabetes, heart attack, stroke, and kidney damage, among  others.     ALCOHOL AND SMOKING:  Women should limit their alcohol intake to no more than 7 drinks/beers/glasses of wine (combined, not each!) per week. Moderation of alcohol intake to this level decreases your risk of breast cancer and liver damage.  ( And of course, no recreational drugs are part of a healthy lifestyle.)  Also, you should not  be smoking at all or even being exposed to second hand smoke. Most people know smoking can cause cancer, and various heart and lung diseases, but did you know it also contributes to weakening of your bones?  Aging of your skin?  Yellowing of your teeth and nails?   CALCIUM AND VITAMIN D:  Adequate intake of calcium and Vitamin D are recommended.  The recommendations for exact amounts of these supplements seem to change often, but generally speaking 600 mg of calcium (either carbonate or citrate) and 800 units of Vitamin D per day seems prudent. Certain women may benefit from higher intake of Vitamin D.  If you are among these women, your doctor will have told you during your visit.     PAP SMEARS:  Pap smears, to check for cervical cancer or precancers,  have traditionally been done yearly, although recent scientific advances have shown that most women can have pap smears less often.  However, every woman still should have a physical exam from her gynecologist or primary care physician every year. It will include a breast check, inspection of the vulva and vagina to check for abnormal growths or skin changes, a visual exam of the cervix, and then an exam to evaluate the size and shape of the uterus and ovaries.  And after 53 years of age, a rectal exam is indicated to check for rectal cancers. We will also provide age appropriate advice regarding health maintenance, like when you should have certain vaccines, screening for sexually transmitted diseases, bone density testing, colonoscopy, mammograms, etc.    MAMMOGRAMS:  All women over 20 years old should have  a yearly mammogram. Many facilities now offer a "3D" mammogram, which may cost around $50 extra out of pocket. If possible,  we recommend you accept the option to have the 3D mammogram performed.  It both reduces the number of women who will be called back for extra views which then turn out to be normal, and it is better than the routine mammogram at detecting truly abnormal areas.     COLONOSCOPY:  Colonoscopy to screen for colon cancer is recommended for all women at age 103.  We know, you hate the idea of the prep.  We agree, BUT, having colon cancer and not knowing it is worse!!  Colon cancer so often starts as a polyp that can be seen and removed at colonscopy, which can quite literally save your life!  And if your first colonoscopy is normal and you have no family history of colon cancer, most women don't have to have it again for 10 years.  Once every ten years, you can do something that may end up saving your life, right?  We will be happy to help you get it scheduled when you are ready.  Be sure to check your insurance coverage so you understand how much it will cost.  It may be covered as a preventative service at no cost, but you should check your particular policy.

## 2019-11-25 LAB — CBC
Hematocrit: 43.5 % (ref 34.0–46.6)
Hemoglobin: 14.9 g/dL (ref 11.1–15.9)
MCH: 29.7 pg (ref 26.6–33.0)
MCHC: 34.3 g/dL (ref 31.5–35.7)
MCV: 87 fL (ref 79–97)
Platelets: 289 10*3/uL (ref 150–450)
RBC: 5.02 x10E6/uL (ref 3.77–5.28)
RDW: 13.7 % (ref 11.7–15.4)
WBC: 6.8 10*3/uL (ref 3.4–10.8)

## 2019-11-25 LAB — COMPREHENSIVE METABOLIC PANEL
ALT: 26 IU/L (ref 0–32)
AST: 44 IU/L — ABNORMAL HIGH (ref 0–40)
Albumin/Globulin Ratio: 1.5 (ref 1.2–2.2)
Albumin: 4.3 g/dL (ref 3.8–4.9)
Alkaline Phosphatase: 94 IU/L (ref 39–117)
BUN/Creatinine Ratio: 21 (ref 9–23)
BUN: 17 mg/dL (ref 6–24)
Bilirubin Total: 0.5 mg/dL (ref 0.0–1.2)
CO2: 23 mmol/L (ref 20–29)
Calcium: 9.6 mg/dL (ref 8.7–10.2)
Chloride: 95 mmol/L — ABNORMAL LOW (ref 96–106)
Creatinine, Ser: 0.8 mg/dL (ref 0.57–1.00)
GFR calc Af Amer: 98 mL/min/{1.73_m2} (ref >59)
GFR calc non Af Amer: 85 mL/min/{1.73_m2} (ref >59)
Globulin, Total: 2.9 g/dL (ref 1.5–4.5)
Glucose: 106 mg/dL — ABNORMAL HIGH (ref 65–99)
Potassium: 3.9 mmol/L (ref 3.5–5.2)
Sodium: 139 mmol/L (ref 134–144)
Total Protein: 7.2 g/dL (ref 6.0–8.5)

## 2019-11-25 LAB — TSH: TSH: 1.45 u[IU]/mL (ref 0.450–4.500)

## 2019-11-25 LAB — T4, FREE: Free T4: 1 ng/dL (ref 0.82–1.77)

## 2019-11-25 LAB — LIPID PANEL
Chol/HDL Ratio: 4.1 ratio (ref 0.0–4.4)
Cholesterol, Total: 211 mg/dL — ABNORMAL HIGH (ref 100–199)
HDL: 52 mg/dL (ref >39)
LDL Chol Calc (NIH): 124 mg/dL — ABNORMAL HIGH (ref 0–99)
Triglycerides: 201 mg/dL — ABNORMAL HIGH (ref 0–149)
VLDL Cholesterol Cal: 35 mg/dL (ref 5–40)

## 2019-11-25 LAB — HEMOGLOBIN A1C
Est. average glucose Bld gHb Est-mCnc: 120 mg/dL
Hgb A1c MFr Bld: 5.8 % — ABNORMAL HIGH (ref 4.8–5.6)

## 2019-11-25 LAB — VITAMIN D 25 HYDROXY (VIT D DEFICIENCY, FRACTURES): Vit D, 25-Hydroxy: 36 ng/mL (ref 30.0–100.0)

## 2019-12-17 ENCOUNTER — Other Ambulatory Visit: Payer: Self-pay | Admitting: Family Medicine

## 2020-01-16 DIAGNOSIS — N952 Postmenopausal atrophic vaginitis: Secondary | ICD-10-CM | POA: Diagnosis not present

## 2020-01-16 DIAGNOSIS — L900 Lichen sclerosus et atrophicus: Secondary | ICD-10-CM | POA: Diagnosis not present

## 2020-01-16 DIAGNOSIS — I209 Angina pectoris, unspecified: Secondary | ICD-10-CM

## 2020-01-16 DIAGNOSIS — Z01419 Encounter for gynecological examination (general) (routine) without abnormal findings: Secondary | ICD-10-CM | POA: Diagnosis not present

## 2020-01-16 DIAGNOSIS — Z6841 Body Mass Index (BMI) 40.0 and over, adult: Secondary | ICD-10-CM | POA: Diagnosis not present

## 2020-01-16 HISTORY — DX: Angina pectoris, unspecified: I20.9

## 2020-01-22 ENCOUNTER — Encounter (HOSPITAL_BASED_OUTPATIENT_CLINIC_OR_DEPARTMENT_OTHER): Payer: Self-pay | Admitting: Emergency Medicine

## 2020-01-22 ENCOUNTER — Other Ambulatory Visit: Payer: Self-pay

## 2020-01-22 ENCOUNTER — Emergency Department (HOSPITAL_BASED_OUTPATIENT_CLINIC_OR_DEPARTMENT_OTHER)
Admission: EM | Admit: 2020-01-22 | Discharge: 2020-01-22 | Disposition: A | Discharge status: Home / Self Care | Payer: BC Managed Care – PPO | Attending: Emergency Medicine | Admitting: Emergency Medicine

## 2020-01-22 ENCOUNTER — Emergency Department (HOSPITAL_BASED_OUTPATIENT_CLINIC_OR_DEPARTMENT_OTHER): Payer: BC Managed Care – PPO

## 2020-01-22 DIAGNOSIS — J939 Pneumothorax, unspecified: Secondary | ICD-10-CM | POA: Diagnosis not present

## 2020-01-22 DIAGNOSIS — R0789 Other chest pain: Secondary | ICD-10-CM

## 2020-01-22 DIAGNOSIS — Z79899 Other long term (current) drug therapy: Secondary | ICD-10-CM | POA: Insufficient documentation

## 2020-01-22 DIAGNOSIS — I10 Essential (primary) hypertension: Secondary | ICD-10-CM | POA: Diagnosis not present

## 2020-01-22 DIAGNOSIS — J984 Other disorders of lung: Secondary | ICD-10-CM | POA: Diagnosis not present

## 2020-01-22 DIAGNOSIS — R079 Chest pain, unspecified: Secondary | ICD-10-CM | POA: Diagnosis not present

## 2020-01-22 LAB — BASIC METABOLIC PANEL
Anion gap: 9 (ref 5–15)
BUN: 17 mg/dL (ref 6–20)
CO2: 26 mmol/L (ref 22–32)
Calcium: 8.8 mg/dL — ABNORMAL LOW (ref 8.9–10.3)
Chloride: 102 mmol/L (ref 98–111)
Creatinine, Ser: 0.81 mg/dL (ref 0.44–1.00)
GFR calc Af Amer: >60 mL/min (ref >60)
GFR calc non Af Amer: >60 mL/min (ref >60)
Glucose, Bld: 98 mg/dL (ref 70–99)
Potassium: 3.4 mmol/L — ABNORMAL LOW (ref 3.5–5.1)
Sodium: 137 mmol/L (ref 135–145)

## 2020-01-22 LAB — CBC
HCT: 41 % (ref 36.0–46.0)
Hemoglobin: 13.8 g/dL (ref 12.0–15.0)
MCH: 29.7 pg (ref 26.0–34.0)
MCHC: 33.7 g/dL (ref 30.0–36.0)
MCV: 88.4 fL (ref 80.0–100.0)
Platelets: 278 10*3/uL (ref 150–400)
RBC: 4.64 MIL/uL (ref 3.87–5.11)
RDW: 13.8 % (ref 11.5–15.5)
WBC: 10.3 10*3/uL (ref 4.0–10.5)
nRBC: 0 % (ref 0.0–0.2)

## 2020-01-22 LAB — TROPONIN I (HIGH SENSITIVITY)
Troponin I (High Sensitivity): 3 ng/L (ref <18)
Troponin I (High Sensitivity): 3 ng/L (ref <18)

## 2020-01-22 NOTE — ED Provider Notes (Signed)
[facility] EMERGENCY DEPARTMENT Provider Note   CSN: [id number] Arrival date & time: 01/22/20  1823     History No chief complaint on file.   Nicole Cooper is a 54 y.o. female.  Patient is a 54 year old female with past medical history of hypercholesterolemia, hypertension, obesity presenting to the emergency department for arm pain and chest pain.  Patient reports that she woke up this morning and had severe sharp pain in her left anterior arm.  Reports that throughout the day it gradually radiated into his left chest and feels like pressure.  It is not exertional.  There are no exacerbating or relieving factors.  She denies any nausea, shortness of breath, leg swelling.  She does not smoke.  No significant family cardiac history.  Has not tried anything for relief.        Past Medical History:  Diagnosis Date  . Allergy   . Breathing problem   . 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 Chest heaviness   . Dental crowns present    x 2  . Dyspnea   . Fluid retention    takes HCTZ prn  . Hair loss   . High triglycerides   . History of urticaria    COUGH  . HTN (hypertension)   . Hx of bronchitis   . Obesity   . Post-nasal drip    year-round    Patient Active Problem List   Diagnosis Date Noted  . 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  . Sinus headache 04/20/2019  . Vitamin D insufficiency 03/16/2017  . Emotional upset- due to her wt 03/16/2017  . Glucose intolerance (impaired glucose tolerance) 03/10/2016  . Sinusitis, acute recurrent 03/10/2016  . Metabolic syndrome 73/22/0254  . S/P hysterectomy with oophorectomy 03/03/2016  . Plantar fasciitis of left foot 03/03/2016  . Obesity, Class III, BMI 40-49.9 (morbid obesity) ([place]) 03/03/2016  . Physical deconditioning 03/03/2016  . GERD (gastroesophageal reflux disease) 03/03/2016  . Environmental and seasonal allergies 03/03/2016  . Feared condition not demonstrated 03/03/2016  . PND (post-nasal  drip) with resultant cough   . Hx of bronchitis   . Dental crowns present   . Fluid retention   . HTN (hypertension)   . High triglycerides   . h/o Hair loss   . History of urticaria   . Breathing problem   . Chest heaviness   . Dyspnea   . Carpal tunnel syndrome of right wrist 12/15/2012    Past Surgical History:  Procedure Laterality Date  . CARPAL TUNNEL RELEASE Left 11/23/2012   Procedure: LEFT CARPAL TUNNEL RELEASE;  Surgeon: Wynonia Sours, MD;  Location: [facility];  Service: Orthopedics;  Laterality: Left;  . CARPAL TUNNEL RELEASE Right 12/21/2012   Procedure: CARPAL TUNNEL RELEASE;  Surgeon: Wynonia Sours, MD;  Location: [facility];  Service: Orthopedics;  Laterality: Right;  . CESAREAN SECTION  12/21/2003  . DILATION AND CURETTAGE OF UTERUS  2003   x 2  . LAPAROSCOPIC ASSISTED VAGINAL HYSTERECTOMY  10/25/2007  . OVARIAN CYST REMOVAL     age 32  . POLYPS REMOVED FROM UTERINE    . SALPINGOOPHORECTOMY  2010  . VAGINAL HYSTERECTOMY    . WISDOM TOOTH EXTRACTION     as a teenager     OB History   No obstetric history on file.     Family History  Problem Relation Age of Onset  . Hyperlipidemia Father   . Cancer - Lung Mother   .  Hypertension Mother   . Cancer Mother        lung  . Hyperlipidemia Brother   . Cancer Maternal Grandmother        breast  . Breast cancer Paternal Grandmother     Social History   Tobacco Use  . Smoking status: Never Smoker  . Smokeless tobacco: Never Used  Substance Use Topics  . Alcohol use: No  . Drug use: No    Home Medications Prior to Admission medications   Medication Sig Start Date End Date Taking? Authorizing Provider  albuterol (PROVENTIL HFA;VENTOLIN HFA) 108 (90 Base) MCG/ACT inhaler Inhale 2 puffs into the lungs every 6 (six) hours as needed for wheezing. 10/12/18   Mellody Dance, DO  azelastine (ASTELIN) 0.1 % nasal spray 1 spray each nostril twice daily after sinus rinses Patient not  taking: Reported on 11/24/2019 04/20/19   Mellody Dance, DO  cetirizine (ZYRTEC) 10 MG chewable tablet Chew 10 mg by mouth daily.    [provider]  conjugated estrogens (PREMARIN) vaginal cream Premarin 0.625 mg/gram vaginal cream  Insert 0.5 applicatorsful twice a week by vaginal route.    [provider]  fluticasone (FLONASE) 50 MCG/ACT nasal spray 1 spray each nostril following sinus rinses twice daily 04/20/19   Opalski, Neoma Laming, DO  hydrochlorothiazide (HYDRODIURIL) 25 MG tablet TAKE 1 TABLET BY MOUTH EVERY DAY 12/18/19   Abonza, Maritza, PA-C  levocetirizine (XYZAL) 5 MG tablet TAKE 1 TABLET BY MOUTH EVERY DAY IN THE EVENING Patient not taking: Reported on 11/24/2019 08/08/19   Mellody Dance, DO  montelukast (SINGULAIR) 10 MG tablet TAKE 1 TABLET BY MOUTH EVERYDAY AT BEDTIME 12/18/19   Abonza, Maritza, PA-C  omeprazole (PRILOSEC) 20 MG capsule TAKE 1 CAPSULE BY MOUTH EVERY DAY 12/18/19   Abonza, Maritza, PA-C  Probiotic Product (PROBIOTIC-10 PO) Take 1 tablet by mouth daily.    [provider]  Soft Lens Products (SM SALINE SOLUTION) SOLN Sterile Saline 0.65% solution for nasal rinses BID 04/20/19   Opalski, Neoma Laming, DO  UNABLE TO FIND Med Name: infrarosa    [provider]  Vitamin D, Ergocalciferol, (DRISDOL) 1.25 MG (50000 UNIT) CAPS capsule Take 1 capsule (50,000 Units total) by mouth every 7 (seven) days. 11/24/19   Mellody Dance, DO    Allergies    Keflex [cephalexin], Percocet [oxycodone-acetaminophen], and Sulfa antibiotics  Review of Systems   Review of Systems  Constitutional: Negative for chills and fever.  HENT: Negative for congestion.   Respiratory: Negative for cough and shortness of breath.   Cardiovascular: Positive for chest pain. Negative for palpitations and leg swelling.  Gastrointestinal: Negative for abdominal distention, abdominal pain, diarrhea, nausea and vomiting.  Genitourinary: Negative for dysuria.  Musculoskeletal: Positive  for arthralgias.  Skin: Negative for rash.  Neurological: Negative for dizziness.  Psychiatric/Behavioral: Negative for confusion.    Physical Exam Updated Vital Signs BP 129/78   Pulse 67   Temp 97.8 F (36.6 C) (Oral)   Resp 16   Ht 5\' 4"  (1.626 m)   Wt 124.9 kg   SpO2 99%   BMI 47.26 kg/m   Physical Exam Vitals and nursing note reviewed.  Constitutional:      General: She is not in acute distress.    Appearance: Normal appearance. She is not ill-appearing, toxic-appearing or diaphoretic.  HENT:     Head: Normocephalic.     Mouth/Throat:     Mouth: Mucous membranes are moist.  Eyes:     Conjunctiva/sclera: Conjunctivae normal.  Cardiovascular:     Rate and Rhythm: Normal rate and regular rhythm.  Pulmonary:     Effort: Pulmonary effort is normal.     Breath sounds: Normal breath sounds.  Abdominal:     General: Abdomen is flat.  Musculoskeletal:     Right lower leg: No edema.     Left lower leg: No edema.     Comments: Mild TTP to the anterior L mid humerus  Skin:    General: Skin is dry.  Neurological:     Mental Status: She is alert.  Psychiatric:        Mood and Affect: Mood normal.     ED Results / Procedures / Treatments   Labs (all labs ordered are listed, but only abnormal results are displayed) Labs Reviewed  BASIC METABOLIC PANEL - Abnormal; Notable for the following components:      Result Value   Potassium 3.4 (*)    Calcium 8.8 (*)    All other components within normal limits  CBC  TROPONIN I (HIGH SENSITIVITY)  TROPONIN I (HIGH SENSITIVITY)    EKG EKG Interpretation  Date/Time:  Monday January 22 2020 18:30:09 EDT Ventricular Rate:  66 PR Interval:  156 QRS Duration: 88 QT Interval:  378 QTC Calculation: 396 R Axis:   -11 Text Interpretation: Normal sinus rhythm Cannot rule out Anterior infarct , age undetermined Abnormal ECG No old tracing to compare Confirmed by Calvert Cantor [phone]) on 01/22/2020 6:39:11 PM   Radiology DG  Chest 2 View  Result Date: 01/22/2020 CLINICAL DATA:  Chest pain. EXAM: CHEST - 2 VIEW COMPARISON:  November 30, 2016 FINDINGS: There is no evidence of acute infiltrate, pleural effusion or pneumothorax. The heart size and mediastinal contours are within normal limits. The visualized skeletal structures are unremarkable. IMPRESSION: No active cardiopulmonary disease. Electronically Signed   By: Virgina Norfolk M.D.   On: 01/22/2020 19:58    Procedures Procedures (including critical care time)  Medications Ordered in ED Medications - No data to display  ED Course  I have reviewed the triage vital signs and the nursing notes.  Pertinent labs & imaging results that were available during my care of the patient were reviewed by me and considered in my medical decision making (see chart for details).  Clinical Course as of Jan 21 2213  Mon Jan 22, 2020  2203 Patient with a heart score of 3 presenting to the ER for arm pain and chest pain. Well appearing. Workup reassuring including trop negative x2.  Possibly musculoskeletal as I can reproduce her arm pain.  Will discharge home and have her follow-up with her primary medical doctor for further work-up.  Advised on return precautions.   [KM]    Clinical Course User Index [KM] Kristine Royal   MDM Rules/Calculators/A&P                      Based on review of vitals, medical screening exam, lab work and/or imaging, there does not appear to be an acute, emergent etiology for the patient's symptoms. Counseled pt on good return precautions and encouraged both PCP and ED follow-up as needed.  Prior to discharge, I also discussed incidental imaging findings with patient in detail and advised appropriate, recommended follow-up in detail.  Clinical Impression: 1. Atypical chest pain     Disposition: Discharge  Prior to providing a prescription for a controlled substance, I independently reviewed the patient's recent prescription history on  the [place] Controlled Substance Reporting System. The patient had no recent or regular prescriptions and was deemed appropriate for a brief, less than 3 day prescription of narcotic for acute analgesia.  This note was prepared with assistance of Systems analyst. Occasional wrong-word or sound-a-like substitutions may have occurred due to the inherent limitations of voice recognition software.  Final Clinical Impression(s) / ED Diagnoses Final diagnoses:  Atypical chest pain    Rx / DC Orders ED Discharge Orders    None       Kristine Royal 01/22/20 2214    Truddie Hidden, MD 01/22/20 2300

## 2020-01-22 NOTE — ED Triage Notes (Signed)
Chest pain and left arm pain starting last night.No other sx.

## 2020-01-22 NOTE — ED Notes (Signed)
ED Provider at bedside. 

## 2020-01-22 NOTE — Discharge Instructions (Signed)
Your work-up today was reassuring.  It is important that you follow-up with your primary medical doctor for further evaluation. Thank you for allowing me to care for you today. Please return to the emergency department if you have new or worsening symptoms. Take your medications as instructed.

## 2020-01-23 ENCOUNTER — Telehealth: Payer: Self-pay | Admitting: Physician Assistant

## 2020-01-23 DIAGNOSIS — R0789 Other chest pain: Secondary | ICD-10-CM | POA: Diagnosis not present

## 2020-01-23 DIAGNOSIS — R079 Chest pain, unspecified: Secondary | ICD-10-CM | POA: Diagnosis not present

## 2020-01-23 DIAGNOSIS — K2100 Gastro-esophageal reflux disease with esophagitis, without bleeding: Secondary | ICD-10-CM | POA: Diagnosis not present

## 2020-01-23 DIAGNOSIS — E669 Obesity, unspecified: Secondary | ICD-10-CM | POA: Diagnosis not present

## 2020-01-23 NOTE — Telephone Encounter (Signed)
Patient states she is having 'chest discomfort' states she was seen in ED yesterday for chest pain.   Patient states pain in chest and in neck and some L arm pain (elbow).  Patient denies SOB, jaw pain, dizziness or nausea.    I advised patient she needs to call 911 or go to ED for evaluation. Patient verbalized understanding and states she will do this.   Lorrene Reid, PA is aware of the advise and is agreeable. AS, CMA

## 2020-01-23 NOTE — Telephone Encounter (Signed)
Pt callded states experienced some chest  & left arm pain last night 01/22/20 & went to ED, she was examined thoroughly but d/c home due all test results being negative for  Heart attack ---- Per patient chest heaviness returned today & she is unsure what to do--- Pt leave for out of town trip on Saturday 01/27/20----Txfd call to med asst for triage.  -glh

## 2020-02-20 ENCOUNTER — Other Ambulatory Visit: Payer: Self-pay | Admitting: Family Medicine

## 2020-02-20 DIAGNOSIS — E559 Vitamin D deficiency, unspecified: Secondary | ICD-10-CM

## 2020-02-20 DIAGNOSIS — Z0000 Encounter for general adult medical examination without abnormal findings: Secondary | ICD-10-CM

## 2020-02-28 ENCOUNTER — Telehealth: Payer: Self-pay | Admitting: Physician Assistant

## 2020-02-28 DIAGNOSIS — E559 Vitamin D deficiency, unspecified: Secondary | ICD-10-CM

## 2020-02-28 DIAGNOSIS — Z0000 Encounter for general adult medical examination without abnormal findings: Secondary | ICD-10-CM

## 2020-02-28 MED ORDER — VITAMIN D (ERGOCALCIFEROL) 1.25 MG (50000 UNIT) PO CAPS: 50000.0000 [IU] | ORAL_CAPSULE | ORAL | 0 refills | Status: DC

## 2020-02-28 NOTE — Telephone Encounter (Signed)
Patient is requesting a refill of her Vit D, if approved please send to [facility] in [place] on [address]

## 2020-02-28 NOTE — Addendum Note (Signed)
Addended by: Fonnie Mu on: 02/28/2020 10:40 AM   Modules accepted: Orders

## 2020-02-28 NOTE — Telephone Encounter (Signed)
Please call pt to schedule appt.  No further refills until pt is seen.  T. Abel Ra, CMA  

## 2020-03-06 DIAGNOSIS — C519 Malignant neoplasm of vulva, unspecified: Secondary | ICD-10-CM | POA: Diagnosis not present

## 2020-03-06 DIAGNOSIS — N9089 Other specified noninflammatory disorders of vulva and perineum: Secondary | ICD-10-CM | POA: Diagnosis not present

## 2020-03-12 ENCOUNTER — Telehealth: Payer: Self-pay

## 2020-03-12 NOTE — Telephone Encounter (Signed)
Gave Ms Roseman  An appointment with Dr. Denman George on 03-19-20 at 12 noon. Arrive at 1130 to check in . Reviewed new patient instructions with  Ms Diebold. Pt verbalized understanding.

## 2020-03-13 NOTE — Telephone Encounter (Signed)
LM for Nicole Cooper stating that Nicole Cooper has an appointment with Dr. Denman George at 12 noon on 03-19-20

## 2020-03-19 ENCOUNTER — Other Ambulatory Visit: Payer: Self-pay | Admitting: Gynecologic Oncology

## 2020-03-19 ENCOUNTER — Encounter: Payer: Self-pay | Admitting: Gynecologic Oncology

## 2020-03-19 ENCOUNTER — Inpatient Hospital Stay: Payer: BC Managed Care – PPO | Attending: Gynecologic Oncology | Admitting: Gynecologic Oncology

## 2020-03-19 ENCOUNTER — Other Ambulatory Visit: Payer: Self-pay

## 2020-03-19 VITALS — BP 126/79 | HR 84 | Temp 98.6°F | Resp 16 | Ht 64.0 in | Wt 269.1 lb

## 2020-03-19 DIAGNOSIS — C519 Malignant neoplasm of vulva, unspecified: Secondary | ICD-10-CM | POA: Insufficient documentation

## 2020-03-19 DIAGNOSIS — Z90710 Acquired absence of both cervix and uterus: Secondary | ICD-10-CM

## 2020-03-19 DIAGNOSIS — C4499 Other specified malignant neoplasm of skin, unspecified: Secondary | ICD-10-CM

## 2020-03-19 DIAGNOSIS — Z90722 Acquired absence of ovaries, bilateral: Secondary | ICD-10-CM | POA: Diagnosis not present

## 2020-03-19 MED ORDER — SENNOSIDES-DOCUSATE SODIUM 8.6-50 MG PO TABS: 2.0000 | ORAL_TABLET | Freq: Every day | ORAL | 0 refills | Status: AC

## 2020-03-19 MED ORDER — TRAMADOL HCL 50 MG PO TABS: 50.0000 mg | ORAL_TABLET | Freq: Four times a day (QID) | ORAL | 0 refills | Status: AC | PRN

## 2020-03-19 MED ORDER — IBUPROFEN 800 MG PO TABS: 800.0000 mg | ORAL_TABLET | Freq: Three times a day (TID) | ORAL | 0 refills | Status: AC | PRN

## 2020-03-19 NOTE — Progress Notes (Signed)
Consult Note: Gyn-Onc  Consult was requested by Dr. Corinna Capra for the evaluation of Nicole Cooper 54 y.o. female  CC:  Chief Complaint  Patient presents with  . Paget's disease of vulva    New Patient    Assessment/Plan:  Nicole Cooper  is a 54 y.o.  year old with extramammary Pagets of the vulva.  I explained to [name] that this is not a malignant condition, but rather a pre-malignant condition of glandular structures. I explained that it can be associated with occult adenocarcinoma, particularly of gyn, GI or breast structures. She is s/p total hysterectomy with subsequent BSO and s/p recent normal colonoscopy and breast mammography. I feel that this is adequate work-up at this time.   I am recommending wide local excision/simple partial vulvectomy. I explained that with Pagets there is a high rate of positive margins, however, we do not recommend that this be re-excised, but, if clinically normal, observed. The goal of surgery is to resect visible and symptomatic disease. There is a 25-50% risk of recurrence postop. She will need to be monitored for this with vulvar inspections.  I described the risks of the procedure in addition to the anticipated postop recovery.  I explained to the patient the recommended postop convalescence.  And advice regarding icing and rest.  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  I believe it will be safe for her to attend the cruise scheduled 6 weeks postop.  However she may feel in too much discomfort to do so.  Explained that her obesity and diabetes puts her at increased risk for wound healing complications including wound separation and cellulitis.   HPI: Ms. Nicole Cooper is a 54 year old P1 who was seen in consultation at the request of Dr. Corinna Capra for evaluation of extramammary Paget's disease of the right labia  majora.  The patient reported having pruritus and a visible white lesion on the anterior right labia majora since about February 2021.  She had a scheduled annual visit with her gynecologist, Dr. Corinna Capra, in June 1 of 2021.  At that visit inspection of the area was suspicious for lichen sclerosis and the patient was empirically prescribed topical steroids with the recommendation to return if this did not improve his symptoms.  She use the steroids for approximately 4 weeks.  After stopping the steroids the lesion returned and therefore the patient reschedule a visit with Dr. Corinna Capra.  At that visit on March 06, 2020 the anterior right labia majora was biopsied by Dr. Corinna Capra and revealed extramammary Paget's disease.  The patient has a personal history of a benign hysterectomy approximately age 21.  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  She had been on hormone replacement therapy with estrogen replacement up until approximately age 26 at which time she discontinued.  The patient's last mammogram was in January 2021.  She reported a normal colonoscopy with the exception of benign polyps that was performed in 2020.  Recommendation was for repeat colonoscopy in 2025.  She had medical history significant for hypertension, hypertriglyceridemia, and prediabetes mellitus.  Her hemoglobin A1c in April 2021 was 5.8%.  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  Current Meds:  Outpatient Encounter Medications as of 03/19/2020  Medication Sig  . albuterol (PROVENTIL HFA;VENTOLIN HFA) 108 (90 Base) MCG/ACT inhaler Inhale 2 puffs into the lungs every 6 (six) hours as needed for wheezing.  . hydrochlorothiazide (HYDRODIURIL) 25 MG tablet TAKE 1 TABLET BY MOUTH EVERY DAY   . montelukast (SINGULAIR) 10 MG tablet TAKE 1 TABLET BY MOUTH EVERYDAY AT BEDTIME  . omeprazole (PRILOSEC) 20 MG capsule TAKE 1 CAPSULE BY MOUTH EVERY DAY  . [DISCONTINUED] azelastine (ASTELIN) 0.1 % nasal spray 1 spray each nostril twice daily after sinus rinses (Patient not taking: Reported on 11/24/2019)  . [DISCONTINUED] cetirizine (ZYRTEC) 10 MG chewable tablet Chew 10 mg by mouth daily. (Patient not taking: Reported on 03/19/2020)  . [DISCONTINUED] conjugated estrogens (PREMARIN) vaginal cream Premarin 0.625 mg/gram vaginal cream  Insert 0.5 applicatorsful twice a week by vaginal route. (Patient not taking: Reported on 03/19/2020)  . [DISCONTINUED] fluticasone (FLONASE) 50 MCG/ACT nasal spray 1 spray each nostril following sinus rinses twice daily (Patient not taking: Reported on 03/19/2020)  . [DISCONTINUED] levocetirizine (XYZAL) 5 MG tablet TAKE 1 TABLET BY MOUTH EVERY DAY IN THE EVENING (Patient not taking: Reported on 11/24/2019)  . [DISCONTINUED] Probiotic Product (PROBIOTIC-10 PO) Take 1 tablet by mouth daily. (Patient not taking: Reported on 03/19/2020)  . [DISCONTINUED] Soft Lens Products (SM SALINE SOLUTION) SOLN Sterile Saline 0.65% solution for nasal rinses BID (Patient not taking: Reported on 03/19/2020)  . [DISCONTINUED] UNABLE TO FIND Med Name: infrarosa (Patient not taking: Reported on 03/19/2020)  . [DISCONTINUED] Vitamin D, Ergocalciferol, (DRISDOL) 1.25 MG (50000 UNIT) CAPS capsule Take 1 capsule (50,000 Units total) by mouth every 7 (seven) days. (Patient not taking: Reported on 03/19/2020)   No facility-administered encounter medications on file as of 03/19/2020.    Allergy:  Allergies  Allergen Reactions  . Keflex [Cephalexin] Itching and Other (See Comments)    Yeast infection  . Percocet [Oxycodone-Acetaminophen] Itching  . Sulfa Antibiotics Rash    Social Hx:   Social History   Socioeconomic History  . Marital status: Married    Spouse name: Not on file  . Number of  children: Not on file  . Years of education: Not on file  . Highest education level: Not on file  Occupational History  . Not on file  Tobacco Use  . Smoking status: Never Smoker  . Smokeless tobacco: Never Used  Vaping Use  . Vaping Use: Never used  Substance and Sexual Activity  . Alcohol use: No  . Drug use: No  . Sexual activity: Yes    Birth control/protection: Surgical  Other Topics Concern  . Not on file  Social History Narrative  . Not on file   Social Determinants of Health   Financial Resource Strain:   . Difficulty of Paying Living Expenses:   Food Insecurity:   . Worried About Charity fundraiser in the Last Year:   . Arboriculturist in the Last Year:   Transportation Needs:   . Film/video editor (Medical):   Marland Kitchen Lack of Transportation (Non-Medical):   Physical Activity:   . Days of Exercise per Week:   . Minutes of Exercise per Session:   Stress:   . Feeling of Stress :   Social Connections:   . Frequency of Communication with Friends and Family:   . Frequency of Social Gatherings with Friends and Family:   . Attends Religious Services:   . Active Member  of Clubs or Organizations:   . Attends Archivist Meetings:   Marland Kitchen Marital Status:   Intimate Partner Violence:   . Fear of Current or Ex-Partner:   . Emotionally Abused:   Marland Kitchen Physically Abused:   . Sexually Abused:     Past Surgical Hx:  Past Surgical History:  Procedure Laterality Date  . CARPAL TUNNEL RELEASE Left 11/23/2012   Procedure: LEFT CARPAL TUNNEL RELEASE;  Surgeon: Wynonia Sours, MD;  Location: [facility];  Service: Orthopedics;  Laterality: Left;  . CARPAL TUNNEL RELEASE Right 12/21/2012   Procedure: CARPAL TUNNEL RELEASE;  Surgeon: Wynonia Sours, MD;  Location: [facility];  Service: Orthopedics;  Laterality: Right;  . CESAREAN SECTION  12/21/2003  . DILATION AND CURETTAGE OF UTERUS  2003   x 2  . LAPAROSCOPIC ASSISTED VAGINAL HYSTERECTOMY  10/25/2007   . OVARIAN CYST REMOVAL     age 19  . POLYPS REMOVED FROM UTERINE    . SALPINGOOPHORECTOMY  2010  . VAGINAL HYSTERECTOMY    . WISDOM TOOTH EXTRACTION     as a teenager    Past Medical Hx:  Past Medical History:  Diagnosis Date  . Allergy   . Breathing problem   . 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 Chest heaviness   . Dental crowns present    x 2  . Dyspnea   . Fluid retention    takes HCTZ prn  . Hair loss   . High triglycerides   . History of urticaria    COUGH  . HTN (hypertension)   . Hx of bronchitis   . Obesity   . Post-nasal drip    year-round    Past Gynecological History:  C/s x 1 No LMP recorded. Patient has had a hysterectomy.  Family Hx:  Family History  Problem Relation Age of Onset  . Hyperlipidemia Father   . Cancer - Lung Mother   . Hypertension Mother   . Cancer Mother        lung  . Hyperlipidemia Brother   . Cancer Maternal Grandmother        breast  . Breast cancer Paternal Grandmother   . Cancer Maternal Grandfather        stomach    Review of Systems:  Constitutional  Feels well,   ENT Normal appearing ears and nares bilaterally Skin/Breast  No rash, sores, jaundice, itching, dryness Cardiovascular  No chest pain, shortness of breath, or edema  Pulmonary  No cough or wheeze.  Gastro Intestinal  No nausea, vomitting, or diarrhoea. No bright red blood per rectum, no abdominal pain, change in bowel movement, or constipation.  Genito Urinary  No frequency, urgency, dysuria, + vulvar pruritis Musculo Skeletal  No myalgia, arthralgia, joint swelling or pain  Neurologic  No weakness, numbness, change in gait,  Psychology  No depression, anxiety, insomnia.   Vitals:  Blood pressure 126/79, pulse 84, temperature 98.6 F (37 C), temperature source Oral, resp. rate 16, height 5\' 4"  (1.626 m), weight 269 lb 2 oz (122.1 kg), SpO2 98 %.  Physical Exam: WD in NAD Neck  Supple NROM, without any enlargements.  Lymph  Node Survey No cervical supraclavicular or inguinal adenopathy Cardiovascular  Pulse normal rate, regularity and rhythm. S1 and S2 normal.  Lungs  Clear to auscultation bilateraly, without wheezes/crackles/rhonchi. Good air movement.  Skin  No rash/lesions/breakdown  Psychiatry  Alert and oriented to person, place, and time  Abdomen  Normoactive bowel sounds, abdomen soft, non-tender and obese without evidence of hernia.  Back No CVA tenderness Genito Urinary  Vulva/vagina: right anterior labia majora with acetowhite changes (subtle) over a 3x1cm area.   Bladder/urethra:  No lesions or masses, well supported bladder  Vagina: no lesions or masses or bleeding  Cervix and uterus surgically absent.  Rectal  deferred Extremities  No bilateral cyanosis, clubbing or edema.   Thereasa Solo, MD  03/19/2020, 1:14 PM

## 2020-03-19 NOTE — Patient Instructions (Addendum)
DUE TO COVID-19 ONLY ONE VISITOR IS ALLOWED TO COME WITH YOU AND STAY IN THE WAITING ROOM ONLY DURING PRE OP AND PROCEDURE DAY OF SURGERY. THE 2 VISITORS MAY VISIT WITH YOU AFTER SURGERY IN YOUR PRIVATE ROOM DURING VISITING HOURS ONLY!  YOU NEED TO HAVE A COVID 19 TEST ON_8/5______ @__10 :00_____, THIS TEST MUST BE DONE BEFORE SURGERY, , COVID TESTING SITE [address] [address], IT IS ON THE RIGHT GOING OUT [address] APPROXITAMELTELY 2 MINUTES PAST [employer] ON THE RIGHT. ONCE YOUR COVID TEST IS COMPLETED,  PLEASE BEGIN THE QUARANTINE INSTRUCTIONS AS OUTLINED IN YOUR HANDOUT.                Gerri Lins    Your procedure is scheduled on: 03/25/20   Report to [facility] Main  Entrance   Report to admitting at  9:40 AM     Call this number if you have problems the morning of surgery [phone]    Remember: Do not eat food after Midnight.  You may have clear liquid until 8:30 AM    CLEAR LIQUID DIET   Foods Allowed                                                                     Foods Excluded  Coffee and tea, regular and decaf                             liquids that you cannot  Plain Jell-O any favor except red or purple                                           see through such as: Fruit ices (not with fruit pulp)                                     milk, soups, orange juice  Iced Popsicles                                    All solid food Carbonated beverages, regular and diet                                    Cranberry, grape and apple juices Sports drinks like Gatorade Lightly seasoned clear broth or consume(fat free) Sugar, honey syrup  Nothing more by mouth after 8:30 AM    BRUSH YOUR TEETH MORNING OF SURGERY AND RINSE YOUR MOUTH OUT, NO CHEWING GUM CANDY OR MINTS.     Take these medicines the morning of surgery with A SIP OF WATER: Omeprizole, use inhalers and bring them with you to the hospital                                  You may  not have any metal on your body including hair pins and              piercings  Do not wear jewelry, make-up, lotions, powders or perfumes, deodorant             Do not wear nail polish on your fingernails.  Do not shave  48 hours prior to surgery.     Do not bring valuables to the hospital. [place].  Contacts, dentures or bridgework may not be worn into surgery.      Patients discharged the day of surgery will not be allowed to drive home.   IF YOU ARE HAVING SURGERY AND GOING HOME THE SAME DAY, YOU MUST HAVE AN ADULT TO DRIVE YOU HOME AND BE WITH YOU FOR 24 HOURS.   YOU MAY GO HOME BY TAXI OR UBER OR ORTHERWISE, BUT AN ADULT MUST ACCOMPANY YOU HOME AND STAY WITH YOU FOR 24 HOURS.  Name and phone number of your driver:  Special Instructions: N/A              Please read over the following fact sheets you were given: _____________________________________________________________________             [facility] - Preparing for Surgery Before surgery, you can play an important role.   Because skin is not sterile, your skin needs to be as free of germs as possible.   You can reduce the number of germs on your skin by washing with CHG (chlorahexidine gluconate) soap before surgery .  CHG is an antiseptic cleaner which kills germs and bonds with the skin to continue killing germs even after washing. Please DO NOT use if you have an allergy to CHG or antibacterial soaps .  If your skin becomes reddened/irritated stop using the CHG and inform your nurse when you arrive at Short Stay. Do not shave (including legs and underarms) for at least 48 hours prior to the first CHG shower.    Please follow these instructions carefully:  1.  Shower with CHG Soap the night before surgery and the  morning of Surgery.  2.  If you choose to wash your hair, wash your hair first as usual with your  normal  shampoo.  3.  After  you shampoo, rinse your hair and body thoroughly to remove the  shampoo.                                        4.  Use CHG as you would any other liquid soap.  You can apply chg directly  to the skin and wash                       Gently with a scrungie or clean washcloth.  5.  Apply the CHG Soap to your body ONLY FROM THE NECK DOWN.   Do not use on face/ open                           Wound or open sores. Avoid contact with eyes, ears mouth and genitals (private parts).                       Wash  face,  Genitals (private parts) with your normal soap.             6.  Wash thoroughly, paying special attention to the area where your surgery  will be performed.  7.  Thoroughly rinse your body with warm water from the neck down.  8.  DO NOT shower/wash with your normal soap after using and rinsing off  the CHG Soap.             9.  Pat yourself dry with a clean towel.            10.  Wear clean pajamas.            11.  Place clean sheets on your bed the night of your first shower and do not  sleep with pets. Day of Surgery : Do not apply any lotions/deodorants the morning of surgery.  Please wear clean clothes to the hospital/surgery center.  FAILURE TO FOLLOW THESE INSTRUCTIONS MAY RESULT IN THE CANCELLATION OF YOUR SURGERY PATIENT SIGNATURE_________________________________  NURSE SIGNATURE__________________________________  ________________________________________________________________________

## 2020-03-19 NOTE — H&P (View-Only) (Signed)
Consult Note: Gyn-Onc  Consult was requested by Dr. Corinna Capra for the evaluation of Nicole Cooper 54 y.o. female  CC:  Chief Complaint  Patient presents with  . Paget's disease of vulva    New Patient    Assessment/Plan:  Nicole Cooper  is a 54 y.o.  year old with extramammary Pagets of the vulva.  I explained to Nicole Cooper that this is not a malignant condition, but rather a pre-malignant condition of glandular structures. I explained that it can be associated with occult adenocarcinoma, particularly of gyn, GI or breast structures. She is s/p total hysterectomy with subsequent BSO and s/p recent normal colonoscopy and breast mammography. I feel that this is adequate work-up at this time.   I am recommending wide local excision/simple partial vulvectomy. I explained that with Pagets there is a high rate of positive margins, however, we do not recommend that this be re-excised, but, if clinically normal, observed. The goal of surgery is to resect visible and symptomatic disease. There is a 25-50% risk of recurrence postop. She will need to be monitored for this with vulvar inspections.  I described the risks of the procedure in addition to the anticipated postop recovery.  I explained to the patient the recommended postop convalescence.  And advice regarding icing and rest.  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  I believe it will be safe for her to attend the cruise scheduled 6 weeks postop.  However she may feel in too much discomfort to do so.  Explained that her obesity and diabetes puts her at increased risk for wound healing complications including wound separation and cellulitis.   HPI: Nicole Cooper is a 54 year old P1 who was seen in consultation at the request of Dr. Corinna Capra for evaluation of extramammary Paget's disease of the right labia  majora.  The patient reported having pruritus and a visible white lesion on the anterior right labia majora since about February 2021.  She had a scheduled annual visit with her gynecologist, Dr. Corinna Capra, in June 1 of 2021.  At that visit inspection of the area was suspicious for lichen sclerosis and the patient was empirically prescribed topical steroids with the recommendation to return if this did not improve his symptoms.  She use the steroids for approximately 4 weeks.  After stopping the steroids the lesion returned and therefore the patient reschedule a visit with Dr. Corinna Capra.  At that visit on March 06, 2020 the anterior right labia majora was biopsied by Dr. Corinna Capra and revealed extramammary Paget's disease.  The patient has a personal history of a benign hysterectomy approximately age 54.  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  She had been on hormone replacement therapy with estrogen replacement up until approximately age 54 at which time she discontinued.  The patient's last mammogram was in January 2021.  She reported a normal colonoscopy with the exception of benign polyps that was performed in 2020.  Recommendation was for repeat colonoscopy in 2025.  She had medical history significant for hypertension, hypertriglyceridemia, and prediabetes mellitus.  Her hemoglobin A1c in April 2021 was 5.8%.  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  Current Meds:  Outpatient Encounter Medications as of 03/19/2020  Medication Sig  . albuterol (PROVENTIL HFA;VENTOLIN HFA) 108 (90 Base) MCG/ACT inhaler Inhale 2 puffs into the lungs every 6 (six) hours as needed for wheezing.  . hydrochlorothiazide (HYDRODIURIL) 25 MG tablet TAKE 1 TABLET BY MOUTH EVERY DAY   . montelukast (SINGULAIR) 10 MG tablet TAKE 1 TABLET BY MOUTH EVERYDAY AT BEDTIME  . omeprazole (PRILOSEC) 20 MG capsule TAKE 1 CAPSULE BY MOUTH EVERY DAY  . [DISCONTINUED] azelastine (ASTELIN) 0.1 % nasal spray 1 spray each nostril twice daily after sinus rinses (Patient not taking: Reported on 11/24/2019)  . [DISCONTINUED] cetirizine (ZYRTEC) 10 MG chewable tablet Chew 10 mg by mouth daily. (Patient not taking: Reported on 03/19/2020)  . [DISCONTINUED] conjugated estrogens (PREMARIN) vaginal cream Premarin 0.625 mg/gram vaginal cream  Insert 0.5 applicatorsful twice a week by vaginal route. (Patient not taking: Reported on 03/19/2020)  . [DISCONTINUED] fluticasone (FLONASE) 50 MCG/ACT nasal spray 1 spray each nostril following sinus rinses twice daily (Patient not taking: Reported on 03/19/2020)  . [DISCONTINUED] levocetirizine (XYZAL) 5 MG tablet TAKE 1 TABLET BY MOUTH EVERY DAY IN THE EVENING (Patient not taking: Reported on 11/24/2019)  . [DISCONTINUED] Probiotic Product (PROBIOTIC-10 PO) Take 1 tablet by mouth daily. (Patient not taking: Reported on 03/19/2020)  . [DISCONTINUED] Soft Lens Products (SM SALINE SOLUTION) SOLN Sterile Saline 0.65% solution for nasal rinses BID (Patient not taking: Reported on 03/19/2020)  . [DISCONTINUED] UNABLE TO FIND Med Name: infrarosa (Patient not taking: Reported on 03/19/2020)  . [DISCONTINUED] Vitamin D, Ergocalciferol, (DRISDOL) 1.25 MG (50000 UNIT) CAPS capsule Take 1 capsule (50,000 Units total) by mouth every 7 (seven) days. (Patient not taking: Reported on 03/19/2020)   No facility-administered encounter medications on file as of 03/19/2020.    Allergy:  Allergies  Allergen Reactions  . Keflex [Cephalexin] Itching and Other (See Comments)    Yeast infection  . Percocet [Oxycodone-Acetaminophen] Itching  . Sulfa Antibiotics Rash    Social Hx:   Social History   Socioeconomic History  . Marital status: Married    Spouse name: Not on file  . Number of  children: Not on file  . Years of education: Not on file  . Highest education level: Not on file  Occupational History  . Not on file  Tobacco Use  . Smoking status: Never Smoker  . Smokeless tobacco: Never Used  Vaping Use  . Vaping Use: Never used  Substance and Sexual Activity  . Alcohol use: No  . Drug use: No  . Sexual activity: Yes    Birth control/protection: Surgical  Other Topics Concern  . Not on file  Social History Narrative  . Not on file   Social Determinants of Health   Financial Resource Strain:   . Difficulty of Paying Living Expenses:   Food Insecurity:   . Worried About Charity fundraiser in the Last Year:   . Arboriculturist in the Last Year:   Transportation Needs:   . Film/video editor (Medical):   Marland Kitchen Lack of Transportation (Non-Medical):   Physical Activity:   . Days of Exercise per Week:   . Minutes of Exercise per Session:   Stress:   . Feeling of Stress :   Social Connections:   . Frequency of Communication with Friends and Family:   . Frequency of Social Gatherings with Friends and Family:   . Attends Religious Services:   . Active Member  of Clubs or Organizations:   . Attends Archivist Meetings:   Marland Kitchen Marital Status:   Intimate Partner Violence:   . Fear of Current or Ex-Partner:   . Emotionally Abused:   Marland Kitchen Physically Abused:   . Sexually Abused:     Past Surgical Hx:  Past Surgical History:  Procedure Laterality Date  . CARPAL TUNNEL RELEASE Left 11/23/2012   Procedure: LEFT CARPAL TUNNEL RELEASE;  Surgeon: Wynonia Sours, MD;  Location: [facility];  Service: Orthopedics;  Laterality: Left;  . CARPAL TUNNEL RELEASE Right 12/21/2012   Procedure: CARPAL TUNNEL RELEASE;  Surgeon: Wynonia Sours, MD;  Location: [facility];  Service: Orthopedics;  Laterality: Right;  . CESAREAN SECTION  12/21/2003  . DILATION AND CURETTAGE OF UTERUS  2003   x 2  . LAPAROSCOPIC ASSISTED VAGINAL HYSTERECTOMY  10/25/2007   . OVARIAN CYST REMOVAL     age 10  . POLYPS REMOVED FROM UTERINE    . SALPINGOOPHORECTOMY  2010  . VAGINAL HYSTERECTOMY    . WISDOM TOOTH EXTRACTION     as a teenager    Past Medical Hx:  Past Medical History:  Diagnosis Date  . Allergy   . Breathing problem   . 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 Chest heaviness   . Dental crowns present    x 2  . Dyspnea   . Fluid retention    takes HCTZ prn  . Hair loss   . High triglycerides   . History of urticaria    COUGH  . HTN (hypertension)   . Hx of bronchitis   . Obesity   . Post-nasal drip    year-round    Past Gynecological History:  C/s x 1 No LMP recorded. Patient has had a hysterectomy.  Family Hx:  Family History  Problem Relation Age of Onset  . Hyperlipidemia Father   . Cancer - Lung Mother   . Hypertension Mother   . Cancer Mother        lung  . Hyperlipidemia Brother   . Cancer Maternal Grandmother        breast  . Breast cancer Paternal Grandmother   . Cancer Maternal Grandfather        stomach    Review of Systems:  Constitutional  Feels well,   ENT Normal appearing ears and nares bilaterally Skin/Breast  No rash, sores, jaundice, itching, dryness Cardiovascular  No chest pain, shortness of breath, or edema  Pulmonary  No cough or wheeze.  Gastro Intestinal  No nausea, vomitting, or diarrhoea. No bright red blood per rectum, no abdominal pain, change in bowel movement, or constipation.  Genito Urinary  No frequency, urgency, dysuria, + vulvar pruritis Musculo Skeletal  No myalgia, arthralgia, joint swelling or pain  Neurologic  No weakness, numbness, change in gait,  Psychology  No depression, anxiety, insomnia.   Vitals:  Blood pressure 126/79, pulse 84, temperature 98.6 F (37 C), temperature source Oral, resp. rate 16, height 5\' 4"  (1.626 m), weight 269 lb 2 oz (122.1 kg), SpO2 98 %.  Physical Exam: WD in NAD Neck  Supple NROM, without any enlargements.  Lymph  Node Survey No cervical supraclavicular or inguinal adenopathy Cardiovascular  Pulse normal rate, regularity and rhythm. S1 and S2 normal.  Lungs  Clear to auscultation bilateraly, without wheezes/crackles/rhonchi. Good air movement.  Skin  No rash/lesions/breakdown  Psychiatry  Alert and oriented to person, place, and time  Abdomen  Normoactive bowel sounds, abdomen soft, non-tender and obese without evidence of hernia.  Back No CVA tenderness Genito Urinary  Vulva/vagina: right anterior labia majora with acetowhite changes (subtle) over a 3x1cm area.   Bladder/urethra:  No lesions or masses, well supported bladder  Vagina: no lesions or masses or bleeding  Cervix and uterus surgically absent.  Rectal  deferred Extremities  No bilateral cyanosis, clubbing or edema.   Nicole Solo, MD  03/19/2020, 1:14 PM

## 2020-03-19 NOTE — Patient Instructions (Signed)
Preparing for your Surgery  Plan for surgery on March 25, 2020 with Dr. Everitt Amber at [facility] will be scheduled for a wide local excision of the vulva.   Pre-operative Testing -You will receive a phone call from presurgical testing at [facility] to arrange for a pre-operative appointment over the phone, lab appointment, and COVID test. The COVID test normally happens 3 days prior to the surgery and they ask that you self quarantine after the test up until surgery to decrease chance of exposure.  -Bring your insurance card, copy of an advanced directive if applicable, medication list  -You should not be taking blood thinners or aspirin at least ten days prior to surgery unless instructed by your surgeon.  -Do not take supplements such as fish oil (omega 3), red yeast rice, turmeric before your surgery.   Day Before Surgery at [facility] will be advised you can have clear liquids after midnight and up until 3 hours before your surgery.    Your role in recovery Your role is to become active as soon as directed by your doctor, while still giving yourself time to heal.  Rest when you feel tired. You will be asked to do the following in order to speed your recovery:  - Cough and breathe deeply. This helps to clear and expand your lungs and can prevent pneumonia after surgery.  - [place]. Do mild physical activity. Walking or moving your legs help your circulation and body functions return to normal. Do not try to get up or walk alone the first time after surgery.   -If you develop swelling on one leg or the other, pain in the back of your leg, redness/warmth in one of your legs, please call the office or go to the Emergency Room to have a doppler to rule out a blood clot. For shortness of breath, chest pain-seek care in the Emergency Room as soon as possible. - Actively manage your pain. Managing your pain lets you move in comfort. We will ask you to  rate your pain on a scale of zero to 10. It is your responsibility to tell your doctor or nurse where and how much you hurt so your pain can be treated.  Pain Management After Surgery -You have been prescribed your pain medication and bowel regimen medications before surgery so that you can have these available when you are discharged from the hospital. The pain medication is for use ONLY AFTER surgery and a new prescription will not be given.   -Make sure that you have Tylenol and Ibuprofen at home to use on a regular basis after surgery for pain control. We recommend alternating the medications every hour to six hours since they work differently and are processed in the body differently for pain relief.  -Review the attached handout on narcotic use and their risks and side effects.   Bowel Regimen -You have been prescribed Sennakot-S to take nightly to prevent constipation especially if you are taking the narcotic pain medication intermittently.  It is important to prevent constipation and drink adequate amounts of liquids. You can stop taking this medication when you are not taking pain medication and you are back on your normal bowel routine.  Risks of Surgery Risks of surgery are low but include bleeding, infection, damage to surrounding structures, re-operation, blood clots, and very rarely death.   AFTER SURGERY INSTRUCTIONS  Return to work: 4 weeks if applicable  Activity: 1.  Be up and out of the bed during the day.  Take a nap if needed.  You may walk up steps but be careful and use the hand rail.  Stair climbing will tire you more than you think, you may need to stop part way and rest.   2. No lifting or straining for 4 weeks over 10 pounds. No pushing, pulling, straining for 4 weeks.  3. No driving for 48 hours after surgery.  Do not drive if you are taking narcotic pain medicine and make sure that your reaction time has returned.   4. You can shower as soon as the next day  after surgery. Shower daily.  Use soap and water on your incision and pat dry; don't rub.  No tub baths or submerging your body in water until cleared by your surgeon. If you have the soap that was given to you by pre-surgical testing that was used before surgery, you do not need to use it afterwards because this can irritate your incisions.   5. No sexual activity and nothing in the vagina for 4 weeks.  6. You may experience a small amount of clear drainage from your incision, which is normal.  If the drainage persists, increases, or changes color please call the office.  7. Take Tylenol or ibuprofen first for pain and only use narcotic pain medication for severe pain not relieved by the Tylenol or Ibuprofen.  Monitor your Tylenol intake to a max of 4,000 mg in a 24 hour period. You can alternate these medications after surgery.  Diet: 1. Low sodium Heart Healthy Diet is recommended.  2. It is safe to use a laxative, such as Miralax or Colace, if you have difficulty moving your bowels. You have been prescribed Sennakot at bedtime every evening to keep bowel movements regular and to prevent constipation.    Wound Care: 1. Keep clean and dry.  Shower daily.  Reasons to call the Doctor:  Fever - Oral temperature greater than 100.4 degrees Fahrenheit  Foul-smelling vaginal discharge  Difficulty urinating  Nausea and vomiting  Increased pain at the site of the incision that is unrelieved with pain medicine.  Difficulty breathing with or without chest pain  New calf pain especially if only on one side  Sudden, continuing increased vaginal bleeding with or without clots.   Contacts: For questions or concerns you should contact:  Dr. Everitt Amber at [phone]  Joylene John, NP at [phone]  After Hours: call [phone] and have the GYN Oncologist paged/contacted

## 2020-03-20 ENCOUNTER — Encounter (HOSPITAL_COMMUNITY)
Admission: RE | Admit: 2020-03-20 | Discharge: 2020-03-20 | Disposition: A | Discharge status: Home / Self Care | Payer: BC Managed Care – PPO | Source: Ambulatory Visit | Attending: Gynecologic Oncology | Admitting: Gynecologic Oncology

## 2020-03-20 ENCOUNTER — Other Ambulatory Visit: Payer: Self-pay

## 2020-03-20 ENCOUNTER — Encounter (HOSPITAL_COMMUNITY): Payer: Self-pay

## 2020-03-20 DIAGNOSIS — Z01812 Encounter for preprocedural laboratory examination: Secondary | ICD-10-CM | POA: Diagnosis not present

## 2020-03-20 HISTORY — DX: Gastro-esophageal reflux disease without esophagitis: K21.9

## 2020-03-20 HISTORY — DX: Unspecified asthma, uncomplicated: J45.909

## 2020-03-20 HISTORY — DX: Prediabetes: R73.03

## 2020-03-20 LAB — CBC
HCT: 43.3 % (ref 36.0–46.0)
Hemoglobin: 14.3 g/dL (ref 12.0–15.0)
MCH: 29.7 pg (ref 26.0–34.0)
MCHC: 33 g/dL (ref 30.0–36.0)
MCV: 89.8 fL (ref 80.0–100.0)
Platelets: 273 10*3/uL (ref 150–400)
RBC: 4.82 MIL/uL (ref 3.87–5.11)
RDW: 14 % (ref 11.5–15.5)
WBC: 7.5 10*3/uL (ref 4.0–10.5)
nRBC: 0 % (ref 0.0–0.2)

## 2020-03-20 LAB — HEMOGLOBIN A1C
Hgb A1c MFr Bld: 5.7 % — ABNORMAL HIGH (ref 4.8–5.6)
Mean Plasma Glucose: 116.89 mg/dL

## 2020-03-20 LAB — BASIC METABOLIC PANEL
Anion gap: 9 (ref 5–15)
BUN: 15 mg/dL (ref 6–20)
CO2: 28 mmol/L (ref 22–32)
Calcium: 9 mg/dL (ref 8.9–10.3)
Chloride: 102 mmol/L (ref 98–111)
Creatinine, Ser: 0.83 mg/dL (ref 0.44–1.00)
GFR calc Af Amer: >60 mL/min (ref >60)
GFR calc non Af Amer: >60 mL/min (ref >60)
Glucose, Bld: 126 mg/dL — ABNORMAL HIGH (ref 70–99)
Potassium: 3.6 mmol/L (ref 3.5–5.1)
Sodium: 139 mmol/L (ref 135–145)

## 2020-03-20 NOTE — Progress Notes (Signed)
COVID Vaccine Completed:Yes Date COVID Vaccine completed: Yes COVID vaccine manufacturer: [employer]     PCP - Lorrene Reid PA Cardiologist - no  Chest x-ray - 01/22/20 EKG - 01/23/20 Stress Test - no ECHO - no Cardiac Cath - no  Sleep Study - no CPAP -   Fasting Blood Sugar - NA Checks Blood Sugar _____ times a day  Blood Thinner Instructions:NA Aspirin Instructions: Last Dose:  Anesthesia review:   Patient denies shortness of breath, fever, cough and chest pain at PAT appointment yes   Patient verbalized understanding of instructions that were given to them at the PAT appointment. Patient was also instructed that they will need to review over the PAT instructions again at home before surgery. Yes  Pt's BMI is 46.3 she gets SOB climbing 2 flights of stairs but not with housework or ADLs.

## 2020-03-21 ENCOUNTER — Telehealth: Payer: Self-pay

## 2020-03-21 ENCOUNTER — Other Ambulatory Visit (HOSPITAL_COMMUNITY)
Admission: RE | Admit: 2020-03-21 | Discharge: 2020-03-21 | Disposition: A | Discharge status: Home / Self Care | Payer: BC Managed Care – PPO | Source: Ambulatory Visit | Attending: Gynecologic Oncology | Admitting: Gynecologic Oncology

## 2020-03-21 DIAGNOSIS — Z20822 Contact with and (suspected) exposure to covid-19: Secondary | ICD-10-CM | POA: Insufficient documentation

## 2020-03-21 DIAGNOSIS — Z01812 Encounter for preprocedural laboratory examination: Secondary | ICD-10-CM | POA: Insufficient documentation

## 2020-03-21 LAB — SARS CORONAVIRUS 2 (TAT 6-24 HRS): SARS Coronavirus 2: NEGATIVE

## 2020-03-21 NOTE — Telephone Encounter (Signed)
Reviewed Nicole Cooper's written  pre op instructions wit her. She verbalized understanding and did no have any questions or concerns.

## 2020-03-21 NOTE — Progress Notes (Addendum)
Pt surgery 03-25-2020 had location change from main [facility] to [facility] with new start time.  Pt had [facility]-PAT visit yesterday 03-20-2020.  Lab results in epic.  Called and spoke w/ pt via phone.  Pt verbalized understanding to arrive @ [facility] 0730 and npo after mn with exception clear liquids until 0630 then nothing by mouth. Told to bring insurance card/ photo ID and only one person is allowed to wait in waiting area.

## 2020-03-25 ENCOUNTER — Other Ambulatory Visit: Payer: Self-pay

## 2020-03-25 ENCOUNTER — Ambulatory Visit (HOSPITAL_BASED_OUTPATIENT_CLINIC_OR_DEPARTMENT_OTHER)
Admission: RE | Admit: 2020-03-25 | Discharge: 2020-03-25 | Disposition: A | Discharge status: Home / Self Care | Payer: BC Managed Care – PPO | Source: Ambulatory Visit | Attending: Gynecologic Oncology | Admitting: Gynecologic Oncology

## 2020-03-25 ENCOUNTER — Ambulatory Visit (HOSPITAL_BASED_OUTPATIENT_CLINIC_OR_DEPARTMENT_OTHER): Payer: BC Managed Care – PPO | Admitting: Anesthesiology

## 2020-03-25 ENCOUNTER — Encounter (HOSPITAL_BASED_OUTPATIENT_CLINIC_OR_DEPARTMENT_OTHER)
Admission: RE | Disposition: A | Discharge status: Home / Self Care | Payer: Self-pay | Source: Ambulatory Visit | Attending: Gynecologic Oncology

## 2020-03-25 ENCOUNTER — Encounter (HOSPITAL_BASED_OUTPATIENT_CLINIC_OR_DEPARTMENT_OTHER): Payer: Self-pay | Admitting: Gynecologic Oncology

## 2020-03-25 DIAGNOSIS — Z90722 Acquired absence of ovaries, bilateral: Secondary | ICD-10-CM | POA: Diagnosis not present

## 2020-03-25 DIAGNOSIS — Z881 Allergy status to other antibiotic agents status: Secondary | ICD-10-CM | POA: Insufficient documentation

## 2020-03-25 DIAGNOSIS — C4499 Other specified malignant neoplasm of skin, unspecified: Secondary | ICD-10-CM

## 2020-03-25 DIAGNOSIS — E669 Obesity, unspecified: Secondary | ICD-10-CM | POA: Insufficient documentation

## 2020-03-25 DIAGNOSIS — Z6841 Body Mass Index (BMI) 40.0 and over, adult: Secondary | ICD-10-CM | POA: Diagnosis not present

## 2020-03-25 DIAGNOSIS — Z885 Allergy status to narcotic agent status: Secondary | ICD-10-CM | POA: Insufficient documentation

## 2020-03-25 DIAGNOSIS — Z882 Allergy status to sulfonamides status: Secondary | ICD-10-CM | POA: Diagnosis not present

## 2020-03-25 DIAGNOSIS — E119 Type 2 diabetes mellitus without complications: Secondary | ICD-10-CM | POA: Insufficient documentation

## 2020-03-25 DIAGNOSIS — C519 Malignant neoplasm of vulva, unspecified: Secondary | ICD-10-CM | POA: Diagnosis present

## 2020-03-25 DIAGNOSIS — Z79899 Other long term (current) drug therapy: Secondary | ICD-10-CM | POA: Diagnosis not present

## 2020-03-25 DIAGNOSIS — J45909 Unspecified asthma, uncomplicated: Secondary | ICD-10-CM | POA: Insufficient documentation

## 2020-03-25 DIAGNOSIS — C510 Malignant neoplasm of labium majus: Secondary | ICD-10-CM | POA: Insufficient documentation

## 2020-03-25 DIAGNOSIS — I10 Essential (primary) hypertension: Secondary | ICD-10-CM | POA: Insufficient documentation

## 2020-03-25 DIAGNOSIS — E559 Vitamin D deficiency, unspecified: Secondary | ICD-10-CM | POA: Diagnosis not present

## 2020-03-25 DIAGNOSIS — L659 Nonscarring hair loss, unspecified: Secondary | ICD-10-CM | POA: Diagnosis not present

## 2020-03-25 DIAGNOSIS — Z90710 Acquired absence of both cervix and uterus: Secondary | ICD-10-CM | POA: Diagnosis not present

## 2020-03-25 HISTORY — PX: VULVECTOMY: SHX1086

## 2020-03-25 LAB — GLUCOSE, CAPILLARY: Glucose-Capillary: 105 mg/dL — ABNORMAL HIGH (ref 70–99)

## 2020-03-25 SURGERY — WIDE EXCISION VULVECTOMY
Anesthesia: General | Site: Vagina

## 2020-03-25 MED ORDER — AMISULPRIDE (ANTIEMETIC) 5 MG/2ML IV SOLN: 10.0000 mg | Freq: Once | INTRAVENOUS | Status: DC | PRN

## 2020-03-25 MED ORDER — DEXAMETHASONE SODIUM PHOSPHATE 10 MG/ML IJ SOLN
INTRAMUSCULAR | Status: AC
  Filled 2020-03-25: qty 1

## 2020-03-25 MED ORDER — LIDOCAINE 2% (20 MG/ML) 5 ML SYRINGE
INTRAMUSCULAR | Status: AC
  Filled 2020-03-25: qty 5

## 2020-03-25 MED ORDER — DEXAMETHASONE SODIUM PHOSPHATE 10 MG/ML IJ SOLN
INTRAMUSCULAR | Status: DC | PRN
  Administered 2020-03-25: 10 mg via INTRAVENOUS

## 2020-03-25 MED ORDER — ONDANSETRON HCL 4 MG/2ML IJ SOLN
INTRAMUSCULAR | Status: AC
  Filled 2020-03-25: qty 2

## 2020-03-25 MED ORDER — LACTATED RINGERS IV SOLN: INTRAVENOUS | Status: DC

## 2020-03-25 MED ORDER — FENTANYL CITRATE (PF) 100 MCG/2ML IJ SOLN
INTRAMUSCULAR | Status: AC
  Filled 2020-03-25: qty 2

## 2020-03-25 MED ORDER — SCOPOLAMINE 1 MG/3DAYS TD PT72
MEDICATED_PATCH | TRANSDERMAL | Status: AC
  Filled 2020-03-25: qty 1

## 2020-03-25 MED ORDER — HYDROMORPHONE HCL 2 MG PO TABS: 2.0000 mg | ORAL_TABLET | Freq: Four times a day (QID) | ORAL | Status: DC | PRN

## 2020-03-25 MED ORDER — ONDANSETRON HCL 4 MG/2ML IJ SOLN
INTRAMUSCULAR | Status: DC | PRN
  Administered 2020-03-25: 4 mg via INTRAVENOUS

## 2020-03-25 MED ORDER — ACETAMINOPHEN 10 MG/ML IV SOLN: 1000.0000 mg | Freq: Once | INTRAVENOUS | Status: DC | PRN

## 2020-03-25 MED ORDER — ACETAMINOPHEN 160 MG/5ML PO SOLN: 325.0000 mg | Freq: Once | ORAL | Status: DC | PRN

## 2020-03-25 MED ORDER — ONDANSETRON HCL 4 MG/2ML IJ SOLN
4.0000 mg | Freq: Four times a day (QID) | INTRAMUSCULAR | Status: DC | PRN
  Administered 2020-03-25: 4 mg via INTRAVENOUS

## 2020-03-25 MED ORDER — MIDAZOLAM HCL 5 MG/5ML IJ SOLN
INTRAMUSCULAR | Status: DC | PRN
  Administered 2020-03-25: 2 mg via INTRAVENOUS

## 2020-03-25 MED ORDER — CEFAZOLIN SODIUM-DEXTROSE 1-4 GM/50ML-% IV SOLN
INTRAVENOUS | Status: AC
  Filled 2020-03-25: qty 50

## 2020-03-25 MED ORDER — KETOROLAC TROMETHAMINE 30 MG/ML IJ SOLN
INTRAMUSCULAR | Status: DC | PRN
  Administered 2020-03-25: 30 mg via INTRAVENOUS

## 2020-03-25 MED ORDER — LIDOCAINE 2% (20 MG/ML) 5 ML SYRINGE
INTRAMUSCULAR | Status: DC | PRN
  Administered 2020-03-25: 100 mg via INTRAVENOUS

## 2020-03-25 MED ORDER — PROPOFOL 10 MG/ML IV BOLUS
INTRAVENOUS | Status: DC | PRN
  Administered 2020-03-25: 200 mg via INTRAVENOUS

## 2020-03-25 MED ORDER — GABAPENTIN 300 MG PO CAPS
ORAL_CAPSULE | ORAL | Status: AC
  Filled 2020-03-25: qty 1

## 2020-03-25 MED ORDER — ACETIC ACID 5 % SOLN
Status: DC | PRN
  Administered 2020-03-25: 1 via TOPICAL

## 2020-03-25 MED ORDER — MIDAZOLAM HCL 2 MG/2ML IJ SOLN
INTRAMUSCULAR | Status: AC
  Filled 2020-03-25: qty 2

## 2020-03-25 MED ORDER — DEXTROSE 5 % IV SOLN
3.0000 g | INTRAVENOUS | Status: AC
  Administered 2020-03-25: 3 g via INTRAVENOUS
  Filled 2020-03-25: qty 3

## 2020-03-25 MED ORDER — OXYCODONE HCL 5 MG PO TABS: 5.0000 mg | ORAL_TABLET | Freq: Once | ORAL | Status: DC | PRN

## 2020-03-25 MED ORDER — MEPERIDINE HCL 25 MG/ML IJ SOLN: 6.2500 mg | INTRAMUSCULAR | Status: DC | PRN

## 2020-03-25 MED ORDER — OXYCODONE HCL 5 MG/5ML PO SOLN: 5.0000 mg | Freq: Once | ORAL | Status: DC | PRN

## 2020-03-25 MED ORDER — FENTANYL CITRATE (PF) 100 MCG/2ML IJ SOLN: 25.0000 ug | INTRAMUSCULAR | Status: DC | PRN

## 2020-03-25 MED ORDER — ACETAMINOPHEN 325 MG PO TABS: 325.0000 mg | ORAL_TABLET | Freq: Once | ORAL | Status: DC | PRN

## 2020-03-25 MED ORDER — SCOPOLAMINE 1 MG/3DAYS TD PT72
1.0000 | MEDICATED_PATCH | TRANSDERMAL | Status: DC
  Administered 2020-03-25: 1.5 mg via TRANSDERMAL

## 2020-03-25 MED ORDER — ONDANSETRON HCL 4 MG PO TABS: 4.0000 mg | ORAL_TABLET | Freq: Four times a day (QID) | ORAL | Status: DC | PRN

## 2020-03-25 MED ORDER — BUPIVACAINE LIPOSOME 1.3 % IJ SUSP
INTRAMUSCULAR | Status: DC | PRN
  Administered 2020-03-25: 20 mL

## 2020-03-25 MED ORDER — CEFAZOLIN SODIUM-DEXTROSE 2-4 GM/100ML-% IV SOLN
INTRAVENOUS | Status: AC
  Filled 2020-03-25: qty 100

## 2020-03-25 MED ORDER — DEXAMETHASONE SODIUM PHOSPHATE 10 MG/ML IJ SOLN: 4.0000 mg | INTRAMUSCULAR | Status: DC

## 2020-03-25 MED ORDER — ACETIC ACID 5 % SOLN
Status: AC
  Filled 2020-03-25: qty 1000

## 2020-03-25 MED ORDER — EPHEDRINE SULFATE-NACL 50-0.9 MG/10ML-% IV SOSY
PREFILLED_SYRINGE | INTRAVENOUS | Status: DC | PRN
  Administered 2020-03-25 (×4): 10 mg via INTRAVENOUS

## 2020-03-25 MED ORDER — FENTANYL CITRATE (PF) 100 MCG/2ML IJ SOLN
INTRAMUSCULAR | Status: DC | PRN
  Administered 2020-03-25 (×2): 50 ug via INTRAVENOUS

## 2020-03-25 MED ORDER — PROPOFOL 10 MG/ML IV BOLUS
INTRAVENOUS | Status: AC
  Filled 2020-03-25: qty 20

## 2020-03-25 MED ORDER — ACETAMINOPHEN 500 MG PO TABS
ORAL_TABLET | ORAL | Status: AC
  Filled 2020-03-25: qty 2

## 2020-03-25 MED ORDER — ACETAMINOPHEN 500 MG PO TABS
1000.0000 mg | ORAL_TABLET | ORAL | Status: AC
  Administered 2020-03-25: 1000 mg via ORAL

## 2020-03-25 MED ORDER — EPHEDRINE 5 MG/ML INJ
INTRAVENOUS | Status: AC
  Filled 2020-03-25: qty 10

## 2020-03-25 MED ORDER — TRAMADOL HCL 50 MG PO TABS: 50.0000 mg | ORAL_TABLET | Freq: Four times a day (QID) | ORAL | Status: DC | PRN

## 2020-03-25 MED ORDER — GABAPENTIN 300 MG PO CAPS
300.0000 mg | ORAL_CAPSULE | ORAL | Status: AC
  Administered 2020-03-25: 300 mg via ORAL

## 2020-03-25 MED ORDER — KETOROLAC TROMETHAMINE 30 MG/ML IJ SOLN
INTRAMUSCULAR | Status: AC
  Filled 2020-03-25: qty 1

## 2020-03-25 MED ORDER — BUPIVACAINE HCL 0.25 % IJ SOLN
INTRAMUSCULAR | Status: DC | PRN
  Administered 2020-03-25: 20 mL

## 2020-03-25 SURGICAL SUPPLY — 28 items
BLADE CLIPPER SENSICLIP SURGIC (BLADE) IMPLANT
BLADE SURG 15 STRL LF DISP TIS (BLADE) ×1 IMPLANT
BLADE SURG 15 STRL SS (BLADE) ×2
CANISTER SUCT 3000ML PPV (MISCELLANEOUS) ×2 IMPLANT
CATH ROBINSON RED A/P 14FR (CATHETERS) ×2 IMPLANT
COVER WAND RF STERILE (DRAPES) ×2 IMPLANT
GAUZE 4X4 16PLY RFD (DISPOSABLE) ×2 IMPLANT
GLOVE BIO SURGEON STRL SZ 6 (GLOVE) ×4 IMPLANT
GOWN STRL REUS W/TWL LRG LVL3 (GOWN DISPOSABLE) ×2 IMPLANT
KIT TURNOVER CYSTO (KITS) ×2 IMPLANT
NDL HYPO 25X1 1.5 SAFETY (NEEDLE) ×1 IMPLANT
NEEDLE HYPO 25X1 1.5 SAFETY (NEEDLE) ×2 IMPLANT
NS IRRIG 500ML POUR BTL (IV SOLUTION) ×2 IMPLANT
PACK PERINEAL COLD (PAD) ×2 IMPLANT
PACK VAGINAL WOMENS (CUSTOM PROCEDURE TRAY) ×2 IMPLANT
SUT VIC AB 0 SH 27 (SUTURE) ×2 IMPLANT
SUT VIC AB 2-0 CT2 27 (SUTURE) IMPLANT
SUT VIC AB 2-0 SH 27 (SUTURE)
SUT VIC AB 2-0 SH 27XBRD (SUTURE) IMPLANT
SUT VIC AB 3-0 SH 27 (SUTURE) ×6
SUT VIC AB 3-0 SH 27X BRD (SUTURE) ×1 IMPLANT
SUT VIC AB 3-0 SH 27XBRD (SUTURE) IMPLANT
SUT VIC AB 4-0 PS2 18 (SUTURE) ×5 IMPLANT
SUT VICRYL 4-0 PS2 18IN ABS (SUTURE) ×2 IMPLANT
SWAB OB GYN 8IN STERILE 2PK (MISCELLANEOUS) ×2 IMPLANT
SYR BULB IRRIG 60ML STRL (SYRINGE) ×2 IMPLANT
TOWEL OR 17X26 10 PK STRL BLUE (TOWEL DISPOSABLE) ×2 IMPLANT
WATER STERILE IRR 500ML POUR (IV SOLUTION) IMPLANT

## 2020-03-25 NOTE — Discharge Instructions (Addendum)
Vulvectomy, Care After The vulva is the external female genitalia, outside and around the vagina and pubic bone. It consists of:  The skin on, and in front of, the pubic bone.  The clitoris.  The labia majora (large lips) on the outside of the vagina.  The labia minora (small lips) around the opening of the vagina.  The opening and the skin in and around the vagina. A vulvectomy is the removal of the tissue of the vulva, which sometimes includes removal of the lymph nodes and tissue in the groin areas. These discharge instructions provide you with general information on caring for yourself after you leave the hospital. It is also important that you know the warning signs of complications, so that you can seek treatment. Please read the instructions outlined below and refer to this sheet in the next few weeks. Your caregiver may also give you specific information and medicines. If you have any questions or complications after discharge, please call your caregiver. ACTIVITY  Rest as much as possible the first two weeks after discharge.  Arrange to have help from family or others with your daily activities when you go home.  Avoid heavy lifting (more than 5 pounds), pushing, or pulling.  If you feel tired, balance your activity with rest periods.  Follow your caregiver's instruction about climbing stairs and driving a car.  Increase activity gradually.  Do not exercise until you have permission from your caregiver. LEG AND FOOT CARE If your doctor has removed lymph nodes from your groin area, there may be an increase in swelling of your legs and feet. You can help prevent swelling by doing the following:  Elevate your legs while sitting or lying down.  If your caregiver has ordered special stockings, wear them according to instructions.  Avoid standing in one place for long periods of time.  Call the physical therapy department if you have any questions about swelling or treatment  for swelling.  Avoid salt in your diet. It can cause fluid retention and swelling.  Do not cross your legs, especially when sitting. NUTRITION  You may resume your normal diet.  Drink 6 to 8 glasses of fluids a day.  Eat a healthy, balanced diet including portions of food from the meat (protein), milk, fruit, vegetable, and bread groups.  Your caregiver may recommend you take a multivitamin with iron. ELIMINATION  You may notice that your stream of urine is at a different angle, and may tend to spray. Using a plastic funnel may help to decrease urine spray.  If constipation occurs, drink more liquids, and add more fruits, vegetables, and bran to your diet. You may take a mild laxative, such as Milk of Magnesia, Metamucil, or a stool softener such as Colace, with permission from your caregiver. HYGIENE  You may shower and wash your hair.  Check with your caregiver about tub baths.  Do not add any bath oils or chemicals to your bath water, after you have permission to take baths.  While passing urine, pour water from a bottle or spray over your vulva to dilute the urine as it passes the incision (this will decrease burning and discomfort).  Clean yourself well after moving your bowels.  After urinating, do not wipe. Dap or pat dry with toilet paper or a dry cleath soft cloth.  A sitz bath will help keep your perineal area clean, reduce swelling, and provide comfort.  Avoid wearing underpants for the first 2 weeks and wear loose skirts to  allow circulation of air around the incision  You do not need to apply dressings, salves or lotions to the wound.  The stitches are self-dissolving and will absorb and disappear over a couple of months (it is normal to notice the knot from the stitches on toilet paper after voiding). HOME CARE INSTRUCTIONS   Apply a soft ice pack (or frozen bag of peas) to your perineum (vulva) every hour in the first 48 hours after surgery. This will reduce  swelling.  Avoid activities that involve a lot of friction between your legs.  Avoid wearing pants or underpants in the 1st 2 weeks (skirts are preferable).  Take your temperature twice a day and record it, especially if you feel feverish or have chills.  Follow your caregiver's instructions about medicines, activity, and follow-up appointments after surgery.  Do not drink alcohol while taking pain medicine.  Change your dressing as advised by your caregiver.  You may take over-the-counter medicine for pain, recommended by your caregiver.  If your pain is not relieved with medicine, call your caregiver.  Do not take aspirin because it can cause bleeding.  Do not douche or use tampons (use a nonperfumed sanitary pad).  Do not have sexual intercourse until your caregiver gives you permission (typically 6 weeks postoperatively). Hugging, kissing, and playful sexual activity is fine with your caregiver's permission.  Warm sitz baths, with your caregiver's permission, are helpful to control swelling and discomfort.  Take showers instead of baths, until your caregiver gives you permission to take baths.  You may take a mild medicine for constipation, recommended by your caregiver. Bran foods and drinking a lot of fluids will help with constipation.  Make sure your family understands everything about your operation and recovery. SEEK MEDICAL CARE IF:   You notice swelling and redness around the wound area.  You notice a foul smell coming from the wound or on the surgical dressing.  You notice the wound is separating.  You have painful or bloody urination.  You develop nausea and vomiting.  You develop diarrhea.  You develop a rash.  You have a reaction or allergy from the medicine.  You feel dizzy or light-headed.  You need stronger pain medicine. SEEK IMMEDIATE MEDICAL CARE IF:   You develop a temperature of 102 F (38.9 C) or higher.  You pass out.  You develop  leg or chest pain.  You develop abdominal pain.  You develop shortness of breath.  You develop bleeding from the wound area.  You see pus in the wound area. MAKE SURE YOU:   Understand these instructions.  Will watch your condition.  Will get help right away if you are not doing well or get worse. Document Released: 03/17/2004 Document Revised: 12/18/2013 Document Reviewed: 07/05/2009 [facility] Patient Information 2015 [place], [place]. This information is not intended to replace advice given to you by your health care provider. Make sure you discuss any questions you have with your health care provider.  Post Anesthesia Home Care Instructions  Activity: Get plenty of rest for the remainder of the day. A responsible adult should stay with you for 24 hours following the procedure.  For the next 24 hours, DO NOT: -Drive a car -Paediatric nurse -Drink alcoholic beverages -Take any medication unless instructed by your physician -Make any legal decisions or sign important papers.  Meals: Start with liquid foods such as gelatin or soup. Progress to regular foods as tolerated. Avoid greasy, spicy, heavy foods. If nausea and/or vomiting occur,  drink only clear liquids until the nausea and/or vomiting subsides. Call your physician if vomiting continues.  Special Instructions/Symptoms: Your throat may feel dry or sore from the anesthesia or the breathing tube placed in your throat during surgery. If this causes discomfort, gargle with warm salt water. The discomfort should disappear within 24 hours.  If you had a scopolamine patch placed behind your ear for the management of post- operative nausea and/or vomiting:  1. The medication in the patch is effective for 72 hours, after which it should be removed.  Wrap patch in a tissue and discard in the trash. Wash hands thoroughly with soap and water. 2. You may remove the patch earlier than 72 hours if you experience unpleasant side effects  which may include dry mouth, dizziness or visual disturbances. 3. Avoid touching the patch. Wash your hands with soap and water after contact with the patch.   Information for Discharge Teaching: EXPAREL (bupivacaine liposome injectable suspension)   Your surgeon gave you EXPAREL(bupivacaine) in your surgical incision to help control your pain after surgery.   EXPAREL is a local anesthetic that provides pain relief by numbing the tissue around the surgical site.  EXPAREL is designed to release pain medication over time and can control pain for up to 72 hours.  Depending on how you respond to EXPAREL, you may require less pain medication during your recovery.  Possible side effects:  Temporary loss of sensation or ability to move in the area where bupivacaine was injected.  Nausea, vomiting, constipation  Rarely, numbness and tingling in your mouth or lips, lightheadedness, or anxiety may occur.  Call your doctor right away if you think you may be experiencing any of these sensations, or if you have other questions regarding possible side effects.  Follow all other discharge instructions given to you by your surgeon or nurse. Eat a healthy diet and drink plenty of water or other fluids.  If you return to the hospital for any reason within 96 hours following the administration of EXPAREL, please inform your health care providers.

## 2020-03-25 NOTE — Op Note (Signed)
OPERATIVE NOTE PATIENT: Nicole Cooper DATE: 03/25/20   Preop Diagnosis:   Postoperative Diagnosis:   Surgery: Partial simple right vulvectomy  Surgeons:  Donaciano Eva, MD Assistant: none  Anesthesia: General   Estimated blood loss: <41ml  IVF:  280ml   Urine output: 10 ml   Complications: None   Pathology: right labia majora with marking stitch at 12 o'clock  Operative findings: pagetoid white changes in 3 x 1cm strip on anterior right labia majora  Procedure: The patient was identified in the preoperative holding area. Informed consent was signed on the chart. Patient was seen history was reviewed and exam was performed.   The patient was then taken to the operating room and placed in the supine position with SCD hose on. General anesthesia was then induced without difficulty. She was then placed in the dorsolithotomy position. The perineum was prepped with Betadine. The vagina was prepped with Betadine. The patient was then draped after the prep was dried. A Foley catheter was inserted into the bladder under sterile conditions.  Timeout was performed the patient, procedure, antibiotic, allergy, and length of procedure. 5% acetic acid solution was applied to the perineum. The vulvar tissues were inspected for areas of acetowhite changes or leukoplakia. The lesion was identified and the marking pen was used to circumscribe the area with appropriate surgical margins. The subcuticular tissues were infiltrated with 0.25% marcaine mixed with exparel. The 15 blade scalpel was used to make an incision through the skin circumferentially as marked. The skin elipse was grasped and was separated from the underlying deep dermal tissues with the bovie device. After the specimen had been completely resected, it was oriented and marked at 12 o'clock with a 0-vicryl suture. The bovie was used to obtain hemostasis at the surgical bed. The subcutaneous tissues were irrigated and made  hemostatic.   The deep dermal layer was approximated with 3-0vicryl mattress sutures to bring the skin edges into approximation and off tension. The wound was closed following langher's lines. The cutaneous layer was closed with interrupted 4-0 vicryl stitches and mattress sutures to ensure a tension free and hemostatic closure. The perineum was again irrigated. The foley was removed.  All instrument, suture, laparotomy, Ray-Tec, and needle counts were correct x2. The patient tolerated the procedure well and was taken recovery room in stable condition. This is Nicole Cooper dictating an operative note on Nicole Cooper. Nicole Solo, MD

## 2020-03-25 NOTE — Anesthesia Procedure Notes (Signed)
Procedure Name: LMA Insertion Date/Time: 03/25/2020 9:35 AM Performed by: Bonney Aid, CRNA Pre-anesthesia Checklist: Patient identified, Emergency Drugs available, Suction available and Patient being monitored Patient Re-evaluated:Patient Re-evaluated prior to induction Oxygen Delivery Method: Circle system utilized Preoxygenation: Pre-oxygenation with 100% oxygen Induction Type: IV induction Ventilation: Mask ventilation without difficulty LMA: LMA inserted LMA Size: 4.0 Number of attempts: 1 Airway Equipment and Method: Bite block Placement Confirmation: positive ETCO2 Tube secured with: Tape Dental Injury: Teeth and Oropharynx as per pre-operative assessment

## 2020-03-25 NOTE — Transfer of Care (Signed)
Immediate Anesthesia Transfer of Care Note  Patient: Nicole Cooper  Procedure(s) Performed: RIGHT PARTIAL MEDIAL VULVECTOMY (N/A Vagina )  Patient Location: PACU  Anesthesia Type:General  Level of Consciousness: awake, alert  and oriented  Airway & Oxygen Therapy: Patient Spontanous Breathing and Patient connected to nasal cannula oxygen  Post-op Assessment: Report given to RN  Post vital signs: Reviewed and stable  Last Vitals:  Vitals Value Taken Time  BP 97/60   Temp    Pulse 90 03/25/20 1032  Resp 16 03/25/20 1032  SpO2 96 % 03/25/20 1032  Vitals shown include unvalidated device data.  Last Pain:  Vitals:   03/25/20 0746  PainSc: 0-No pain      Patients Stated Pain Goal: 5 (53/31/74 0992)  Complications: No complications documented.

## 2020-03-25 NOTE — Interval H&P Note (Signed)
History and Physical Interval Note:  03/25/2020 9:18 AM  Nicole Cooper  has presented today for surgery, with the diagnosis of PAGETS OF VULVA.  The various methods of treatment have been discussed with the patient and family. After consideration of risks, benefits and other options for treatment, the patient has consented to  Procedure(s): WIDE EXCISION VULVECTOMY (N/A) as a surgical intervention.  The patient's history has been reviewed, patient examined, no change in status, stable for surgery.  I have reviewed the patient's chart and labs.  Questions were answered to the patient's satisfaction.     Thereasa Solo

## 2020-03-25 NOTE — Anesthesia Postprocedure Evaluation (Signed)
Anesthesia Post Note  Patient: Nicole Cooper  Procedure(s) Performed: RIGHT PARTIAL MEDIAL VULVECTOMY (N/A Vagina )     Patient location during evaluation: PACU Anesthesia Type: General Level of consciousness: awake and alert Pain management: pain level controlled Vital Signs Assessment: post-procedure vital signs reviewed and stable Respiratory status: spontaneous breathing, nonlabored ventilation, respiratory function stable and patient connected to nasal cannula oxygen Cardiovascular status: blood pressure returned to baseline and stable Postop Assessment: no apparent nausea or vomiting Anesthetic complications: no   No complications documented.  Last Vitals:  Vitals:   03/25/20 1115 03/25/20 1130  BP: (!) 107/55   Pulse: 76 79  Resp: 10 (!) 21  Temp:    SpO2: 93% 95%    Last Pain:  Vitals:   03/25/20 1130  PainSc: 0-No pain                 Effie Berkshire

## 2020-03-25 NOTE — Anesthesia Preprocedure Evaluation (Addendum)
Anesthesia Evaluation  Patient identified by MRN, date of birth, ID band Patient awake    Reviewed: Allergy & Precautions, NPO status , Patient's Chart, lab work & pertinent test results  Airway Mallampati: II  TM Distance: >3 FB Neck ROM: Full    Dental  (+) Teeth Intact, Dental Advisory Given   Pulmonary asthma ,    breath sounds clear to auscultation       Cardiovascular hypertension, Pt. on medications + angina  Rhythm:Regular Rate:Normal     Neuro/Psych  Headaches,  Neuromuscular disease negative psych ROS   GI/Hepatic Neg liver ROS, GERD  Medicated,  Endo/Other  negative endocrine ROS  Renal/GU negative Renal ROS     Musculoskeletal negative musculoskeletal ROS (+)   Abdominal (+) + obese,   Peds  Hematology negative hematology ROS (+)   Anesthesia Other Findings   Reproductive/Obstetrics                            Anesthesia Physical Anesthesia Plan  ASA: III  Anesthesia Plan: General   Post-op Pain Management:    Induction: Intravenous  PONV Risk Score and Plan: 4 or greater and Ondansetron, Dexamethasone, Midazolam and Scopolamine patch - Pre-op  Airway Management Planned: LMA  Additional Equipment: None  Intra-op Plan:   Post-operative Plan: Extubation in OR  Informed Consent: I have reviewed the patients History and Physical, chart, labs and discussed the procedure including the risks, benefits and alternatives for the proposed anesthesia with the patient or authorized representative who has indicated his/her understanding and acceptance.     Dental advisory given  Plan Discussed with: CRNA  Anesthesia Plan Comments:        Anesthesia Quick Evaluation

## 2020-03-26 ENCOUNTER — Encounter (HOSPITAL_BASED_OUTPATIENT_CLINIC_OR_DEPARTMENT_OTHER): Payer: Self-pay | Admitting: Gynecologic Oncology

## 2020-03-26 ENCOUNTER — Telehealth: Payer: Self-pay

## 2020-03-26 LAB — SURGICAL PATHOLOGY

## 2020-03-26 NOTE — Telephone Encounter (Signed)
Nicole Cooper states that she is eating, drinking, and urinating well. Passing gas. Instructed her to increase the senokot-s to 2 tabs bid with 8 oz of water. Afebrile. Incision is D&I. She is not in any pain. She is aware of her post op appointment as well as the office number [phone] if she has any questions or concerns.

## 2020-03-29 ENCOUNTER — Other Ambulatory Visit: Payer: Self-pay | Admitting: Physician Assistant

## 2020-03-29 ENCOUNTER — Telehealth: Payer: Self-pay

## 2020-03-29 NOTE — Telephone Encounter (Signed)
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  The margins were clear per Joylene John, NP.

## 2020-04-11 ENCOUNTER — Telehealth: Payer: Self-pay | Admitting: *Deleted

## 2020-04-11 NOTE — Telephone Encounter (Signed)
Called and moved the patient appt from 3:45 pm to 2:15 pm

## 2020-04-15 ENCOUNTER — Encounter: Payer: Self-pay | Admitting: Gynecologic Oncology

## 2020-04-15 ENCOUNTER — Inpatient Hospital Stay (HOSPITAL_BASED_OUTPATIENT_CLINIC_OR_DEPARTMENT_OTHER): Payer: BC Managed Care – PPO | Admitting: Gynecologic Oncology

## 2020-04-15 ENCOUNTER — Other Ambulatory Visit: Payer: Self-pay

## 2020-04-15 VITALS — BP 115/75 | HR 87 | Temp 99.2°F | Resp 16 | Ht 64.0 in | Wt 272.0 lb

## 2020-04-15 DIAGNOSIS — Z90722 Acquired absence of ovaries, bilateral: Secondary | ICD-10-CM | POA: Diagnosis not present

## 2020-04-15 DIAGNOSIS — C519 Malignant neoplasm of vulva, unspecified: Secondary | ICD-10-CM | POA: Diagnosis not present

## 2020-04-15 DIAGNOSIS — Z90710 Acquired absence of both cervix and uterus: Secondary | ICD-10-CM | POA: Diagnosis not present

## 2020-04-15 NOTE — Progress Notes (Signed)
Follow-up Note: Gyn-Onc  Consult was requested by Dr. Corinna Capra for the evaluation of Nicole Cooper 54 y.o. female  CC:  Chief Complaint  Patient presents with  . Paget's disease of vulva    Post Op    Assessment/Plan:  Nicole Cooper  is a 54 y.o.  year old with extramammary Pagets of the vulva.  Negative margins, benign pagets on final pathology. Up to date with cancer screenings (no evidence of associated adenocarcinoma).  Recommend 1 year follow-up with me and continued surveillance with Dr Corinna Capra.   HPI: Nicole Cooper is a 54 year old P1 who was seen in consultation at the request of Dr. 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  The patient reported having pruritus and a visible white lesion on the anterior right labia majora since about February 2021.  She had a scheduled annual visit with her gynecologist, Dr. Corinna Capra, in June 1 of 2021.  At that visit inspection of the area was suspicious for lichen sclerosis and the patient was empirically prescribed topical steroids with the recommendation to return if this did not improve his symptoms.  She use the steroids for approximately 4 weeks.  After stopping the steroids the lesion returned and therefore the patient reschedule a visit with Dr. Corinna Capra.  At that visit on March 06, 2020 the anterior right labia majora was biopsied by Dr. Corinna Capra and revealed extramammary Paget's disease.  The patient has a personal history of a benign hysterectomy approximately age 57.  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  She had been on hormone replacement therapy with estrogen replacement up until approximately age 39 at which time she discontinued.  The patient's last mammogram was in January 2021.  She reported a normal colonoscopy with the exception of benign polyps that was performed  in 2020.  Recommendation was for repeat colonoscopy in 2025.  She had medical history significant for hypertension, hypertriglyceridemia, and prediabetes mellitus.  Her hemoglobin A1c in April 2021 was 5.8%.  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  Interval Hx:  On 03/25/20 she underwent wide local excision of the vulva. Intraoperative findings were significant for pagetoid changes n a 3x1cm strip on anterior righ labia majora. Surgery was uncomplicated .  Final pathology revealed pagets extramammary, 3cm. Margins free.  Since surgery she has done well.  Current Meds:  Outpatient Encounter Medications as of 04/15/2020  Medication Sig  . albuterol (PROVENTIL HFA;VENTOLIN HFA) 108 (90 Base) MCG/ACT inhaler Inhale 2 puffs into the lungs every 6 (six) hours as needed for wheezing.  . cholecalciferol (VITAMIN D3) 25 MCG (1000 UNIT) tablet Take 1,000 Units by mouth as directed. Once a week  . diphenhydrAMINE (BENADRYL) 25 MG tablet Take 25 mg by mouth at bedtime.  . hydrochlorothiazide (HYDRODIURIL) 25 MG tablet Take 1 tablet (25 mg total) by mouth daily. **PATIENT NEEDS APT FOR FURTHER REFILLS**  . ibuprofen (ADVIL) 800 MG tablet Take 1 tablet (800 mg total) by mouth every 8 (eight) hours as needed for moderate pain. For AFTER surgery  . montelukast (SINGULAIR) 10 MG tablet Take 1 tablet (10 mg total) by mouth at bedtime. **PATIENT NEEDS APT FOR FURTHER REFILLS**  . omeprazole (PRILOSEC) 20 MG capsule Take 1 capsule (20 mg total)  by mouth daily. **PATIENT NEEDS APT FOR FURTHER REFILLS**  . senna-docusate (SENOKOT-S) 8.6-50 MG tablet Take 2 tablets by mouth at bedtime. For AFTER surgery, do not take if having diarrhea  . traMADol (ULTRAM) 50 MG tablet Take 1 tablet (50 mg total) by mouth every 6 (six) hours as needed for severe pain. For AFTER surgery, do not take and drive  (Patient not taking: Reported on 04/15/2020)   No facility-administered encounter medications on file as of 04/15/2020.    Allergy:  Allergies  Allergen Reactions  . Keflex [Cephalexin] Itching and Other (See Comments)    Yeast infection  . Percocet [Oxycodone-Acetaminophen] Itching  . Sulfa Antibiotics Rash    Social Hx:   Social History   Socioeconomic History  . Marital status: Married    Spouse name: Not on file  . Number of children: Not on file  . Years of education: Not on file  . Highest education level: Not on file  Occupational History  . Not on file  Tobacco Use  . Smoking status: Never Smoker  . Smokeless tobacco: Never Used  Vaping Use  . Vaping Use: Never used  Substance and Sexual Activity  . Alcohol use: No  . Drug use: No  . Sexual activity: Yes    Birth control/protection: Surgical  Other Topics Concern  . Not on file  Social History Narrative  . Not on file   Social Determinants of Health   Financial Resource Strain:   . Difficulty of Paying Living Expenses: Not on file  Food Insecurity:   . Worried About Charity fundraiser in the Last Year: Not on file  . Ran Out of Food in the Last Year: Not on file  Transportation Needs:   . Lack of Transportation (Medical): Not on file  . Lack of Transportation (Non-Medical): Not on file  Physical Activity:   . Days of Exercise per Week: Not on file  . Minutes of Exercise per Session: Not on file  Stress:   . Feeling of Stress : Not on file  Social Connections:   . Frequency of Communication with Friends and Family: Not on file  . Frequency of Social Gatherings with Friends and Family: Not on file  . Attends Religious Services: Not on file  . Active Member of Clubs or Organizations: Not on file  . Attends Archivist Meetings: Not on file  . Marital Status: Not on file  Intimate Partner Violence:   . Fear of Current or Ex-Partner: Not on file  . Emotionally Abused: Not on file  .  Physically Abused: Not on file  . Sexually Abused: Not on file    Past Surgical Hx:  Past Surgical History:  Procedure Laterality Date  . CARPAL TUNNEL RELEASE Left 11/23/2012   Procedure: LEFT CARPAL TUNNEL RELEASE;  Surgeon: Wynonia Sours, MD;  Location: [facility];  Service: Orthopedics;  Laterality: Left;  . CARPAL TUNNEL RELEASE Right 12/21/2012   Procedure: CARPAL TUNNEL RELEASE;  Surgeon: Wynonia Sours, MD;  Location: [facility];  Service: Orthopedics;  Laterality: Right;  . CESAREAN SECTION  12/21/2003  . DILATION AND CURETTAGE OF UTERUS  2003   x 2  . LAPAROSCOPIC ASSISTED VAGINAL HYSTERECTOMY  10/25/2007  . OVARIAN CYST REMOVAL     age 5  . POLYPS REMOVED FROM UTERINE    . SALPINGOOPHORECTOMY  2010  . VAGINAL HYSTERECTOMY    . VULVECTOMY N/A 03/25/2020   Procedure: RIGHT PARTIAL MEDIAL  VULVECTOMY;  Surgeon: Everitt Amber, MD;  Location: [facility];  Service: Gynecology;  Laterality: N/A;  . WISDOM TOOTH EXTRACTION     as a teenager    Past Medical Hx:  Past Medical History:  Diagnosis Date  . Allergy   . Anginal pain ([place]) 01/2020   due to GERD  . Asthma    use inhaler if I have a cold  . Breathing problem   . 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 Chest heaviness   . Dental crowns present    x 2  . Dyspnea   . Fluid retention    takes HCTZ prn  . GERD (gastroesophageal reflux disease)   . Hair loss   . High triglycerides   . History of urticaria    COUGH  . HTN (hypertension)   . Hx of bronchitis   . Obesity   . Post-nasal drip    year-round  . Pre-diabetes     Past Gynecological History:  C/s x 1 No LMP recorded. Patient has had a hysterectomy.  Family Hx:  Family History  Problem Relation Age of Onset  . Hyperlipidemia Father   . Cancer - Lung Mother   . Hypertension Mother   . Cancer Mother        lung  . Hyperlipidemia Brother   . Cancer Maternal Grandmother        breast  . Breast cancer  Paternal Grandmother   . Cancer Maternal Grandfather        stomach    Review of Systems:  Constitutional  Feels well,   ENT Normal appearing ears and nares bilaterally Skin/Breast  No rash, sores, jaundice, itching, dryness Cardiovascular  No chest pain, shortness of breath, or edema  Pulmonary  No cough or wheeze.  Gastro Intestinal  No nausea, vomitting, or diarrhoea. No bright red blood per rectum, no abdominal pain, change in bowel movement, or constipation.  Genito Urinary  No frequency, urgency, dysuria, + vulvar pruritis Musculo Skeletal  No myalgia, arthralgia, joint swelling or pain  Neurologic  No weakness, numbness, change in gait,  Psychology  No depression, anxiety, insomnia.   Vitals:  Blood pressure 115/75, pulse 87, temperature 99.2 F (37.3 C), temperature source Tympanic, resp. rate 16, height 5\' 4"  (1.626 m), weight 272 lb (123.4 kg), SpO2 100 %.  Physical Exam: WD in NAD Neck  Supple NROM, without any enlargements.  Lymph Node Survey No cervical supraclavicular or inguinal adenopathy Cardiovascular  Pulse normal rate, regularity and rhythm. S1 and S2 normal.  Lungs  Clear to auscultation bilateraly, without wheezes/crackles/rhonchi. Good air movement.  Skin  No rash/lesions/breakdown  Psychiatry  Alert and oriented to person, place, and time  Abdomen  Normoactive bowel sounds, abdomen soft, non-tender and obese without evidence of hernia.  Back No CVA tenderness Genito Urinary  Vulva/vagina: well healed vulvar incision. Some separation inferiorly on right vulva.   Bladder/urethra:  No lesions or masses, well supported bladder  Vagina: no lesions or masses or bleeding  Cervix and uterus surgically absent.  Rectal  deferred Extremities  No bilateral cyanosis, clubbing or edema.   Thereasa Solo, MD  04/15/2020, 3:34 PM

## 2020-04-15 NOTE — Patient Instructions (Signed)
Dr Denman George recommends a vulva check with her in 12 months. You can continue to follow-up with Dr Corinna Capra annually for your wellness checks.  Please call Dr Denman George at [phone] if you have concerns about healing or new lesions.

## 2020-04-19 ENCOUNTER — Other Ambulatory Visit: Payer: Self-pay | Admitting: Physician Assistant

## 2020-05-11 ENCOUNTER — Other Ambulatory Visit: Payer: Self-pay | Admitting: Physician Assistant

## 2020-05-11 DIAGNOSIS — E559 Vitamin D deficiency, unspecified: Secondary | ICD-10-CM

## 2020-05-11 DIAGNOSIS — Z0000 Encounter for general adult medical examination without abnormal findings: Secondary | ICD-10-CM

## 2020-06-25 ENCOUNTER — Other Ambulatory Visit: Payer: Self-pay | Admitting: Physician Assistant

## 2020-06-25 ENCOUNTER — Telehealth: Payer: Self-pay | Admitting: Physician Assistant

## 2020-06-25 NOTE — Telephone Encounter (Signed)
Please call patient to schedule apt for further refills. AS, CMA 

## 2020-07-12 ENCOUNTER — Other Ambulatory Visit: Payer: Self-pay | Admitting: Physician Assistant

## 2020-07-12 DIAGNOSIS — Z0000 Encounter for general adult medical examination without abnormal findings: Secondary | ICD-10-CM

## 2020-07-12 DIAGNOSIS — E559 Vitamin D deficiency, unspecified: Secondary | ICD-10-CM

## 2020-07-15 ENCOUNTER — Telehealth: Payer: Self-pay | Admitting: Physician Assistant

## 2020-07-15 NOTE — Telephone Encounter (Signed)
Please call patient to schedule OV for further med refills. AS, CMA 

## 2020-07-20 ENCOUNTER — Other Ambulatory Visit: Payer: Self-pay | Admitting: Physician Assistant

## 2020-07-22 ENCOUNTER — Telehealth: Payer: Self-pay | Admitting: Physician Assistant

## 2020-07-22 NOTE — Telephone Encounter (Signed)
Please call patient to schedule OV apt for medication refills. AS, CMA

## 2020-07-23 NOTE — Telephone Encounter (Signed)
Left voicemail 12-7

## 2020-07-31 ENCOUNTER — Other Ambulatory Visit: Payer: Self-pay | Admitting: Physician Assistant

## 2020-07-31 NOTE — Telephone Encounter (Signed)
Attempted to call patient on both numbers listed with no answer. Left voicemail for patient to call office. Also sending Mychart message.   We have reached out to patient several times with no answer and no call back. Denying med refills per refill protocol. AS, CMA

## 2020-08-11 ENCOUNTER — Other Ambulatory Visit: Payer: Self-pay | Admitting: Physician Assistant

## 2020-08-11 DIAGNOSIS — Z0000 Encounter for general adult medical examination without abnormal findings: Secondary | ICD-10-CM

## 2020-08-11 DIAGNOSIS — E559 Vitamin D deficiency, unspecified: Secondary | ICD-10-CM

## 2020-08-13 ENCOUNTER — Telehealth: Payer: Self-pay | Admitting: Physician Assistant

## 2020-08-13 NOTE — Telephone Encounter (Signed)
Please call patient to schedule OV apt for med refills. AS, CMA °

## 2020-10-09 DIAGNOSIS — J019 Acute sinusitis, unspecified: Secondary | ICD-10-CM | POA: Diagnosis not present

## 2020-10-09 DIAGNOSIS — R051 Acute cough: Secondary | ICD-10-CM | POA: Diagnosis not present

## 2020-10-09 DIAGNOSIS — Z20828 Contact with and (suspected) exposure to other viral communicable diseases: Secondary | ICD-10-CM | POA: Diagnosis not present

## 2020-12-04 DIAGNOSIS — D2239 Melanocytic nevi of other parts of face: Secondary | ICD-10-CM | POA: Diagnosis not present

## 2020-12-04 DIAGNOSIS — L814 Other melanin hyperpigmentation: Secondary | ICD-10-CM | POA: Diagnosis not present

## 2020-12-04 DIAGNOSIS — L820 Inflamed seborrheic keratosis: Secondary | ICD-10-CM | POA: Diagnosis not present

## 2020-12-10 DIAGNOSIS — Z6841 Body Mass Index (BMI) 40.0 and over, adult: Secondary | ICD-10-CM | POA: Diagnosis not present

## 2020-12-10 DIAGNOSIS — I10 Essential (primary) hypertension: Secondary | ICD-10-CM | POA: Diagnosis not present

## 2020-12-10 DIAGNOSIS — J309 Allergic rhinitis, unspecified: Secondary | ICD-10-CM | POA: Diagnosis not present

## 2020-12-10 DIAGNOSIS — E559 Vitamin D deficiency, unspecified: Secondary | ICD-10-CM | POA: Diagnosis not present

## 2020-12-24 DIAGNOSIS — L820 Inflamed seborrheic keratosis: Secondary | ICD-10-CM | POA: Diagnosis not present

## 2021-03-03 ENCOUNTER — Other Ambulatory Visit: Payer: Self-pay | Admitting: Obstetrics and Gynecology

## 2021-03-03 DIAGNOSIS — Z1231 Encounter for screening mammogram for malignant neoplasm of breast: Secondary | ICD-10-CM

## 2021-03-04 ENCOUNTER — Other Ambulatory Visit: Payer: Self-pay

## 2021-03-04 ENCOUNTER — Ambulatory Visit
Admission: RE | Admit: 2021-03-04 | Discharge: 2021-03-04 | Disposition: A | Discharge status: Home / Self Care | Payer: BC Managed Care – PPO | Source: Ambulatory Visit | Attending: Obstetrics and Gynecology | Admitting: Obstetrics and Gynecology

## 2021-03-04 DIAGNOSIS — Z1231 Encounter for screening mammogram for malignant neoplasm of breast: Secondary | ICD-10-CM

## 2021-03-18 DIAGNOSIS — Z01419 Encounter for gynecological examination (general) (routine) without abnormal findings: Secondary | ICD-10-CM | POA: Diagnosis not present

## 2021-03-18 DIAGNOSIS — Z6841 Body Mass Index (BMI) 40.0 and over, adult: Secondary | ICD-10-CM | POA: Diagnosis not present

## 2021-04-17 DIAGNOSIS — U071 COVID-19: Secondary | ICD-10-CM | POA: Diagnosis not present

## 2021-04-17 DIAGNOSIS — Z20828 Contact with and (suspected) exposure to other viral communicable diseases: Secondary | ICD-10-CM | POA: Diagnosis not present

## 2021-06-10 DIAGNOSIS — N6459 Other signs and symptoms in breast: Secondary | ICD-10-CM | POA: Diagnosis not present

## 2021-06-11 ENCOUNTER — Other Ambulatory Visit: Payer: Self-pay | Admitting: Obstetrics and Gynecology

## 2021-06-11 DIAGNOSIS — N6459 Other signs and symptoms in breast: Secondary | ICD-10-CM

## 2021-06-11 DIAGNOSIS — N63 Unspecified lump in unspecified breast: Secondary | ICD-10-CM

## 2021-06-21 ENCOUNTER — Ambulatory Visit
Admission: RE | Admit: 2021-06-21 | Discharge: 2021-06-21 | Disposition: A | Discharge status: Home / Self Care | Payer: BC Managed Care – PPO | Source: Ambulatory Visit | Attending: Obstetrics and Gynecology | Admitting: Obstetrics and Gynecology

## 2021-06-21 ENCOUNTER — Other Ambulatory Visit: Payer: Self-pay | Admitting: Obstetrics and Gynecology

## 2021-06-21 ENCOUNTER — Other Ambulatory Visit: Payer: Self-pay

## 2021-06-21 DIAGNOSIS — R922 Inconclusive mammogram: Secondary | ICD-10-CM | POA: Diagnosis not present

## 2021-06-21 DIAGNOSIS — N6459 Other signs and symptoms in breast: Secondary | ICD-10-CM

## 2021-06-21 DIAGNOSIS — N63 Unspecified lump in unspecified breast: Secondary | ICD-10-CM

## 2021-06-21 DIAGNOSIS — N6452 Nipple discharge: Secondary | ICD-10-CM | POA: Diagnosis not present

## 2021-06-25 ENCOUNTER — Other Ambulatory Visit: Payer: BC Managed Care – PPO

## 2021-06-26 ENCOUNTER — Other Ambulatory Visit: Payer: BC Managed Care – PPO

## 2021-07-28 DIAGNOSIS — Z23 Encounter for immunization: Secondary | ICD-10-CM | POA: Diagnosis not present

## 2021-08-17 ENCOUNTER — Other Ambulatory Visit: Payer: Self-pay

## 2021-08-17 ENCOUNTER — Encounter (HOSPITAL_COMMUNITY): Payer: Self-pay

## 2021-08-17 ENCOUNTER — Ambulatory Visit (HOSPITAL_COMMUNITY)
Admission: EM | Admit: 2021-08-17 | Discharge: 2021-08-17 | Disposition: A | Discharge status: Home / Self Care | Payer: BC Managed Care – PPO | Attending: Physician Assistant | Admitting: Physician Assistant

## 2021-08-17 DIAGNOSIS — R1011 Right upper quadrant pain: Secondary | ICD-10-CM | POA: Insufficient documentation

## 2021-08-17 LAB — POCT URINALYSIS DIPSTICK, ED / UC
Bilirubin Urine: NEGATIVE
Glucose, UA: NEGATIVE mg/dL
Ketones, ur: NEGATIVE mg/dL
Leukocytes,Ua: NEGATIVE
Nitrite: NEGATIVE
Protein, ur: NEGATIVE mg/dL
Specific Gravity, Urine: 1.025 (ref 1.005–1.030)
Urobilinogen, UA: 0.2 mg/dL (ref 0.0–1.0)
pH: 5.5 (ref 5.0–8.0)

## 2021-08-17 LAB — CBC WITH DIFFERENTIAL/PLATELET
Abs Immature Granulocytes: 0.03 10*3/uL (ref 0.00–0.07)
Basophils Absolute: 0.1 10*3/uL (ref 0.0–0.1)
Basophils Relative: 1 %
Eosinophils Absolute: 0.1 10*3/uL (ref 0.0–0.5)
Eosinophils Relative: 1 %
HCT: 43.1 % (ref 36.0–46.0)
Hemoglobin: 15.8 g/dL — ABNORMAL HIGH (ref 12.0–15.0)
Immature Granulocytes: 0 %
Lymphocytes Relative: 29 %
Lymphs Abs: 2.8 10*3/uL (ref 0.7–4.0)
MCH: 32 pg (ref 26.0–34.0)
MCHC: 36.7 g/dL — ABNORMAL HIGH (ref 30.0–36.0)
MCV: 87.4 fL (ref 80.0–100.0)
Monocytes Absolute: 0.4 10*3/uL (ref 0.1–1.0)
Monocytes Relative: 5 %
Neutro Abs: 6.1 10*3/uL (ref 1.7–7.7)
Neutrophils Relative %: 64 %
Platelets: 293 10*3/uL (ref 150–400)
RBC: 4.93 MIL/uL (ref 3.87–5.11)
RDW: 13.2 % (ref 11.5–15.5)
WBC: 9.4 10*3/uL (ref 4.0–10.5)
nRBC: 0 % (ref 0.0–0.2)

## 2021-08-17 LAB — COMPREHENSIVE METABOLIC PANEL
ALT: 55 U/L — ABNORMAL HIGH (ref 0–44)
AST: 67 U/L — ABNORMAL HIGH (ref 15–41)
Albumin: 3.8 g/dL (ref 3.5–5.0)
Alkaline Phosphatase: 81 U/L (ref 38–126)
Anion gap: 7 (ref 5–15)
BUN: 16 mg/dL (ref 6–20)
CO2: 28 mmol/L (ref 22–32)
Calcium: 9.6 mg/dL (ref 8.9–10.3)
Chloride: 102 mmol/L (ref 98–111)
Creatinine, Ser: 0.67 mg/dL (ref 0.44–1.00)
GFR, Estimated: >60 mL/min (ref >60)
Glucose, Bld: 97 mg/dL (ref 70–99)
Potassium: 3.5 mmol/L (ref 3.5–5.1)
Sodium: 137 mmol/L (ref 135–145)
Total Bilirubin: 0.3 mg/dL (ref 0.3–1.2)
Total Protein: 7.2 g/dL (ref 6.5–8.1)

## 2021-08-17 NOTE — ED Provider Notes (Signed)
[facility]    CSN: [id number] Arrival date & time: 08/17/21  1400      History   Chief Complaint Chief Complaint  Patient presents with   Abdominal Pain    HPI Nicole Cooper is a 56 y.o. female.   Patient presents today with a episode of severe right upper quadrant abdominal pain that occurred around 1 AM this morning.  She reports this was severe for several hours but then improved without intervention.  She did try peppermint oil which provided some relief of symptoms.  She reports ongoing discomfort but this is much less severe than overnight.  Symptoms were triggered by eating and she had another flare after breakfast this morning.  She has been eating a very healthy diet but does report some dietary indiscretion over the past week related to the holidays.  She has a history of GERD but states this is well controlled since losing approximately 60 pounds.  Pain is rated 6 on a 0-10 pain scale, localized to right upper quadrant, described as intense aching with periodic sharp pains, worse following a meal, no alleviating factors identified.  She denies any associated changes in bowel habits, blood in her stool, fever, jaundice, lightheadedness or near syncope, nausea/vomiting.  She has had previous hysterectomy and oophorectomy but denies additional abdominal surgeries including cholecystectomy.   Past Medical History:  Diagnosis Date   Allergy    Anginal pain ([place]) 01/2020   due to GERD   Asthma    use inhaler if I have a cold   Breathing problem    Carpal tunnel syndrome of right wrist 12/2012   Chest heaviness    Dental crowns present    x 2   Dyspnea    Fluid retention    takes HCTZ prn   GERD (gastroesophageal reflux disease)    Hair loss    High triglycerides    History of urticaria    COUGH   HTN (hypertension)    Hx of bronchitis    Obesity    Post-nasal drip    year-round   Pre-diabetes     Patient Active Problem List   Diagnosis Date Noted    Paget's disease of vulva ([place]) 03/19/2020   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   Sinus headache 04/20/2019   Vitamin D insufficiency 03/16/2017   Emotional upset- due to her wt 03/16/2017   Glucose intolerance (impaired glucose tolerance) 03/10/2016   Sinusitis, acute recurrent 94/85/4627   Metabolic syndrome 03/50/0938   S/P hysterectomy with oophorectomy 03/03/2016   Plantar fasciitis of left foot 03/03/2016   Obesity, Class III, BMI 40-49.9 (morbid obesity) ([place]) 03/03/2016   Physical deconditioning 03/03/2016   GERD (gastroesophageal reflux disease) 03/03/2016   Environmental and seasonal allergies 03/03/2016   Feared condition not demonstrated 03/03/2016   PND (post-nasal drip) with resultant cough    Hx of bronchitis    Dental crowns present    Fluid retention    HTN (hypertension)    High triglycerides    h/o Hair loss    History of urticaria    Breathing problem    Chest heaviness    Dyspnea    Carpal tunnel syndrome of right wrist 12/15/2012    Past Surgical History:  Procedure Laterality Date   CARPAL TUNNEL RELEASE Left 11/23/2012   Procedure: LEFT CARPAL TUNNEL RELEASE;  Surgeon: Wynonia Sours, MD;  Location: [facility];  Service: Orthopedics;  Laterality: Left;   CARPAL TUNNEL RELEASE Right 12/21/2012  Procedure: CARPAL TUNNEL RELEASE;  Surgeon: Wynonia Sours, MD;  Location: [facility];  Service: Orthopedics;  Laterality: Right;   CESAREAN SECTION  12/21/2003   DILATION AND CURETTAGE OF UTERUS  2003   x 2   LAPAROSCOPIC ASSISTED VAGINAL HYSTERECTOMY  10/25/2007   OVARIAN CYST REMOVAL     age 16   POLYPS REMOVED FROM UTERINE     SALPINGOOPHORECTOMY  2010   VAGINAL HYSTERECTOMY     VULVECTOMY N/A 03/25/2020   Procedure: RIGHT PARTIAL MEDIAL VULVECTOMY;  Surgeon: Everitt Amber, MD;  Location: [facility];  Service: Gynecology;  Laterality: N/A;   WISDOM TOOTH EXTRACTION     as a teenager    OB History   No obstetric history  on file.      Home Medications    Prior to Admission medications   Medication Sig Start Date End Date Taking? Authorizing Provider  albuterol (PROVENTIL HFA;VENTOLIN HFA) 108 (90 Base) MCG/ACT inhaler Inhale 2 puffs into the lungs every 6 (six) hours as needed for wheezing. 10/12/18   Mellody Dance, DO  cholecalciferol (VITAMIN D3) 25 MCG (1000 UNIT) tablet Take 1,000 Units by mouth as directed. Once a week    [provider]  diphenhydrAMINE (BENADRYL) 25 MG tablet Take 25 mg by mouth at bedtime.    [provider]  hydrochlorothiazide (HYDRODIURIL) 25 MG tablet TAKE 1 TABLET (25 MG TOTAL) BY MOUTH DAILY. **PATIENT NEEDS APT FOR FURTHER REFILLS** 07/22/20   Lorrene Reid, PA-C  ibuprofen (ADVIL) 800 MG tablet Take 1 tablet (800 mg total) by mouth every 8 (eight) hours as needed for moderate pain. For AFTER surgery 03/19/20   Joylene John D, NP  montelukast (SINGULAIR) 10 MG tablet TAKE 1 TABLET (10 MG TOTAL) BY MOUTH AT BEDTIME. **PATIENT NEEDS APT FOR FURTHER REFILLS** 07/22/20   Lorrene Reid, PA-C  omeprazole (PRILOSEC) 20 MG capsule Take 1 capsule (20 mg total) by mouth daily. **PATIENT NEEDS APT FOR FURTHER REFILLS** 04/01/20   Abonza, Maritza, PA-C  senna-docusate (SENOKOT-S) 8.6-50 MG tablet Take 2 tablets by mouth at bedtime. For AFTER surgery, do not take if having diarrhea 03/19/20   Cross, Lenna Sciara D, NP  traMADol (ULTRAM) 50 MG tablet Take 1 tablet (50 mg total) by mouth every 6 (six) hours as needed for severe pain. For AFTER surgery, do not take and drive Patient not taking: Reported on 04/15/2020 03/19/20   Joylene John D, NP  Vitamin D, Ergocalciferol, (DRISDOL) 1.25 MG (50000 UNIT) CAPS capsule TAKE 1 CAPSULE (50,000 UNITS TOTAL) BY MOUTH EVERY 7 (SEVEN) DAYS. OFFICE VISIT REQUIRED PRIOR TO ANY FURTHER REFILLS 07/15/20   Lorrene Reid, PA-C    Family History Family History  Problem Relation Age of Onset   Hyperlipidemia Father    Cancer - Lung Mother     Hypertension Mother    Cancer Mother        lung   Hyperlipidemia Brother    Cancer Maternal Grandmother        breast   Breast cancer Paternal Grandmother    Cancer Maternal Grandfather        stomach    Social History Social History   Tobacco Use   Smoking status: Never   Smokeless tobacco: Never  Vaping Use   Vaping Use: Never used  Substance Use Topics   Alcohol use: No   Drug use: No     Allergies   Keflex [cephalexin], Percocet [oxycodone-acetaminophen], and Sulfa antibiotics   Review of  Systems Review of Systems  Constitutional:  Positive for activity change. Negative for appetite change, fatigue and fever.  Respiratory:  Negative for cough and shortness of breath.   Cardiovascular:  Negative for chest pain.  Gastrointestinal:  Positive for abdominal pain. Negative for blood in stool, constipation, diarrhea, nausea and vomiting.  Musculoskeletal:  Negative for arthralgias and myalgias.  Neurological:  Negative for dizziness, light-headedness and headaches.    Physical Exam Triage Vital Signs ED Triage Vitals [08/17/21 1526]  Enc Vitals Group     BP 122/81     Pulse Rate 67     Resp 18     Temp 98.4 F (36.9 C)     Temp Source Oral     SpO2 100 %     Weight      Height      Head Circumference      Peak Flow      Pain Score 6     Pain Loc      Pain Edu?      Excl. in [place]?    No data found.  Updated Vital Signs BP 122/81 (BP Location: Left Arm)    Pulse 67    Temp 98.4 F (36.9 C) (Oral)    Resp 18    SpO2 100%   Visual Acuity Right Eye Distance:   Left Eye Distance:   Bilateral Distance:    Right Eye Near:   Left Eye Near:    Bilateral Near:     Physical Exam Vitals reviewed.  Constitutional:      General: She is awake. She is not in acute distress.    Appearance: Normal appearance. She is well-developed. She is not ill-appearing.     Comments: Very pleasant female appears stated age in no acute distress sitting comfortably in exam  room  HENT:     Head: Normocephalic and atraumatic.     Mouth/Throat:     Mouth: Mucous membranes are moist.  Eyes:     General: No scleral icterus. Cardiovascular:     Rate and Rhythm: Normal rate and regular rhythm.     Heart sounds: Normal heart sounds, S1 normal and S2 normal. No murmur heard. Pulmonary:     Effort: Pulmonary effort is normal.     Breath sounds: Normal breath sounds. No wheezing, rhonchi or rales.     Comments: Clear to auscultation bilaterally Abdominal:     General: Bowel sounds are normal.     Palpations: Abdomen is soft.     Tenderness: There is abdominal tenderness in the right upper quadrant. There is no right CVA tenderness, left CVA tenderness, guarding or rebound. Negative signs include Murphy's sign.     Comments: Tender to palpation in right upper quadrant.  Negative Murphy sign.  No evidence of acute abdomen on physical exam.  Skin:    Coloration: Skin is not jaundiced.  Psychiatric:        Behavior: Behavior is cooperative.     UC Treatments / Results  Labs (all labs ordered are listed, but only abnormal results are displayed) Labs Reviewed  POCT URINALYSIS DIPSTICK, ED / UC - Abnormal; Notable for the following components:      Result Value   Hgb urine dipstick TRACE (*)    All other components within normal limits  CBC WITH DIFFERENTIAL/PLATELET  COMPREHENSIVE METABOLIC PANEL    EKG   Radiology No results found.  Procedures Procedures (including critical care time)  Medications Ordered in UC Medications -  No data to display  Initial Impression / Assessment and Plan / UC Course  I have reviewed the triage vital signs and the nursing notes.  Pertinent labs & imaging results that were available during my care of the patient were reviewed by me and considered in my medical decision making (see chart for details).     UA was negative in clinic today with exception of trace hemoglobin.  Discussed concern for cholelithiasis as  etiology of symptoms.  Unfortunately, we are unable to get imaging in urgent care and so recommended outpatient follow-up given reassuring vital signs and physical exam today.  She was given contact information for local GI specialist and encouraged to call to schedule appointment as soon as possible.  She is to avoid fatty foods.  Discussed that if she has any worsening symptoms including severe pain, nausea/vomiting interfering with oral intake, jaundice, scleral icterus, hypertension, confusion, near-syncope or syncope she is to go to the emergency room.  Strict return precaution given to which she expressed understanding.  Final Clinical Impressions(s) / UC Diagnoses   Final diagnoses:  RUQ abdominal pain     Discharge Instructions      I am concerned about your gallbladder.  Please follow-up with GI as we discussed.  If you have any worsening symptoms including yellowing of your skin, severe pain, nausea/vomiting interfering with oral intake, lightheadedness, low blood pressure, passing out you need to go to the emergency room immediately.  Avoid fatty foods as we discussed.  We will contact you if your lab work is abnormal.     ED Prescriptions   None    PDMP not reviewed this encounter.   Terrilee Croak, PA-C 08/17/21 1624

## 2021-08-17 NOTE — ED Triage Notes (Signed)
Pt presented to the office for abdominal pain that started last night. Last bowel movement was this morning. She does not report any injuries to her stomach.

## 2021-08-17 NOTE — Discharge Instructions (Signed)
I am concerned about your gallbladder.  Please follow-up with GI as we discussed.  If you have any worsening symptoms including yellowing of your skin, severe pain, nausea/vomiting interfering with oral intake, lightheadedness, low blood pressure, passing out you need to go to the emergency room immediately.  Avoid fatty foods as we discussed.  We will contact you if your lab work is abnormal.

## 2021-08-18 IMAGING — MG MM DIGITAL SCREENING BILAT W/ TOMO AND CAD
8 series · 8 of 24 positions shown · non-contrast
Comparison: Previous exam(s).

ACR Breast Density Category a: The breast tissue is almost entirely
fatty.

CLINICAL DATA: Screening.

EXAM:
DIGITAL SCREENING BILATERAL MAMMOGRAM WITH TOMOSYNTHESIS AND CAD
TECHNIQUE: Bilateral screening digital craniocaudal and mediolateral oblique
mammograms were obtained. Bilateral screening digital breast
tomosynthesis was performed. The images were evaluated with
computer-aided detection.

[L MLO synth-2D]
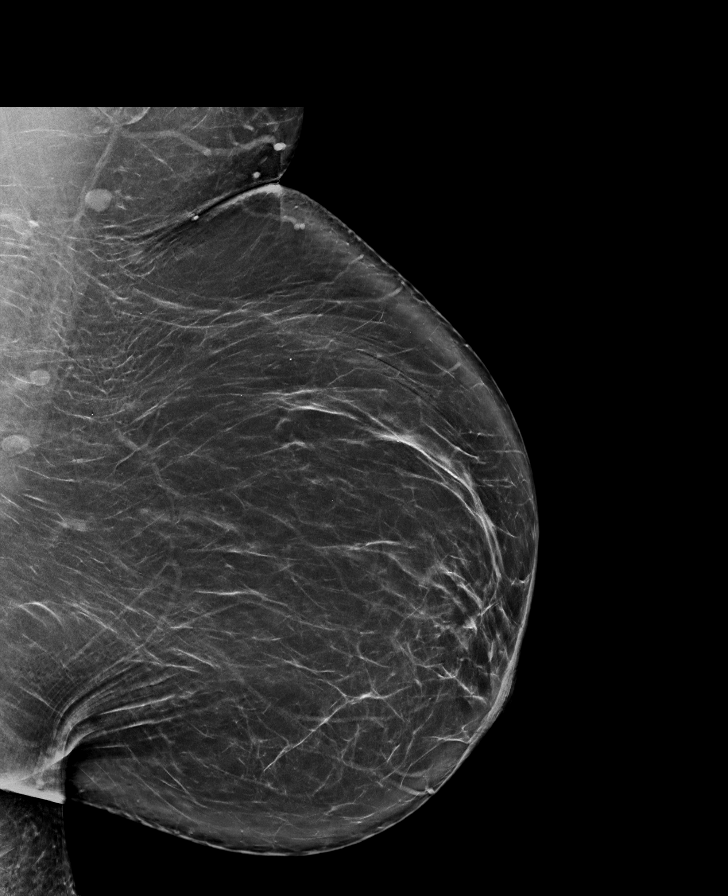

[R MLO synth-2D]
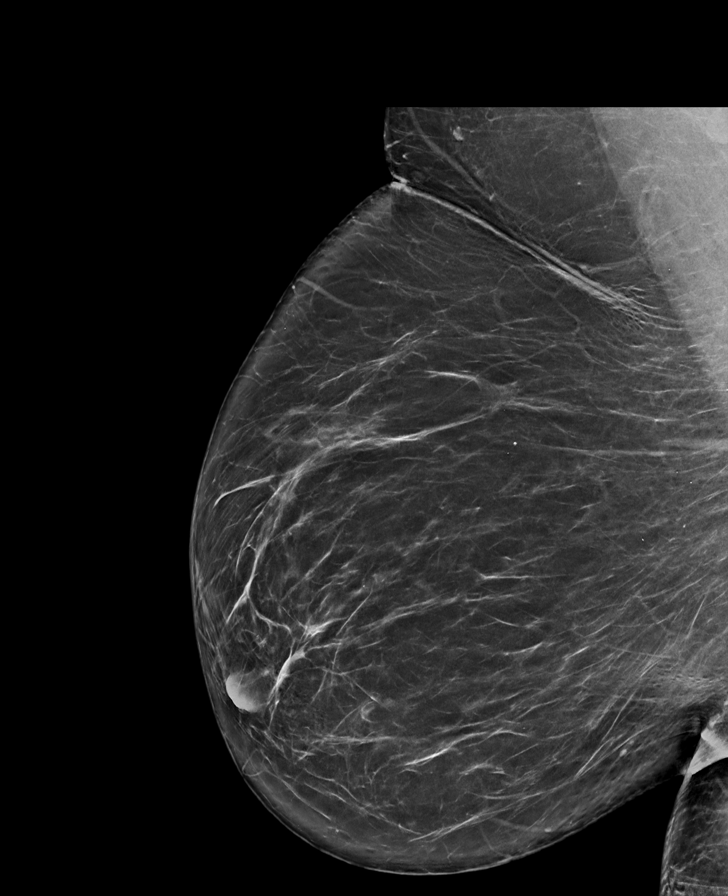

[L CC synth-2D]
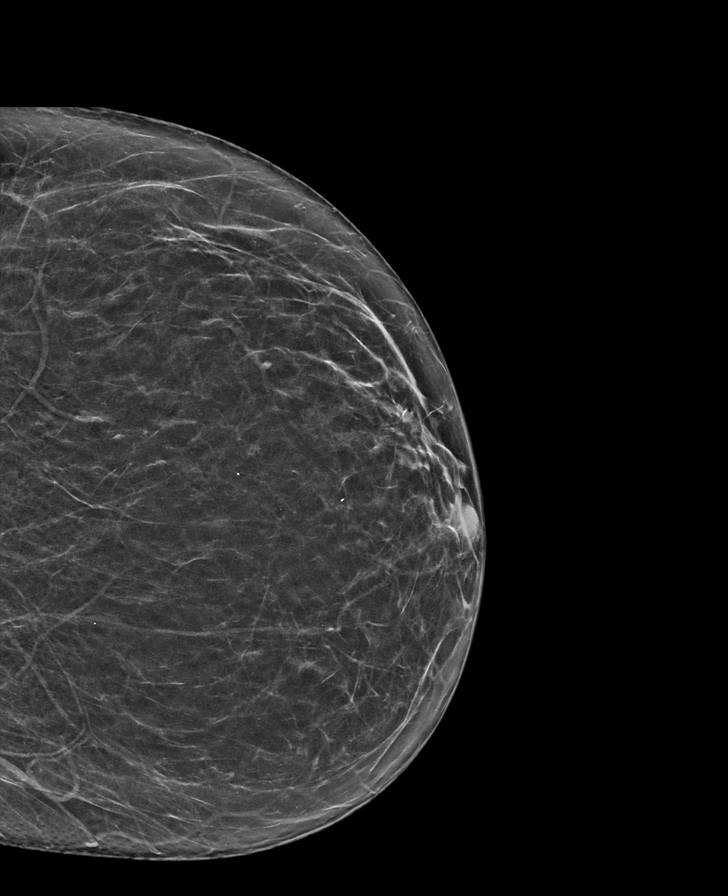

[R CC synth-2D]
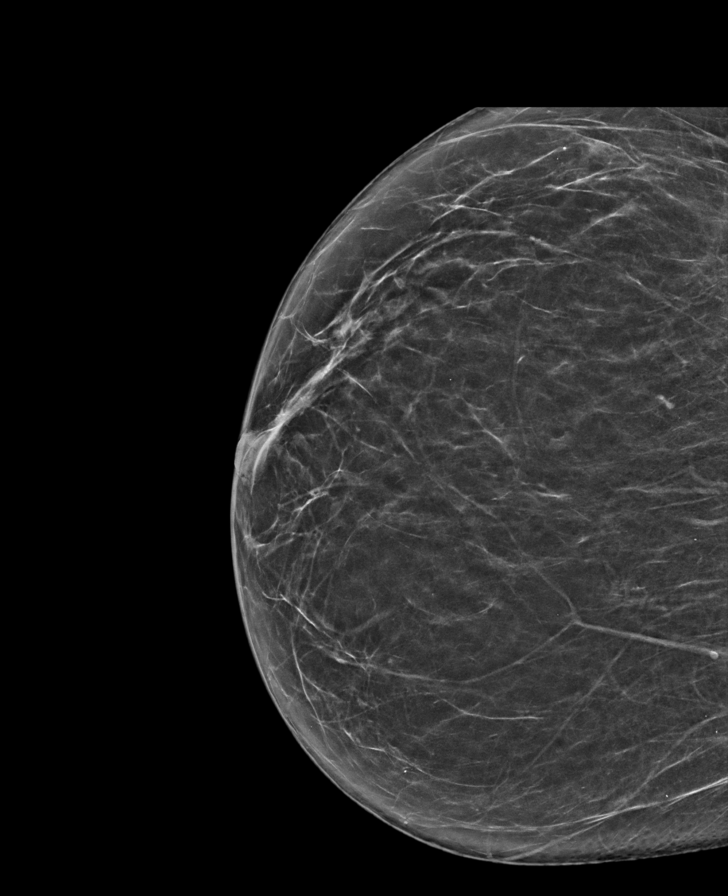

[L CC tomo · tomo slice 33/66.0]
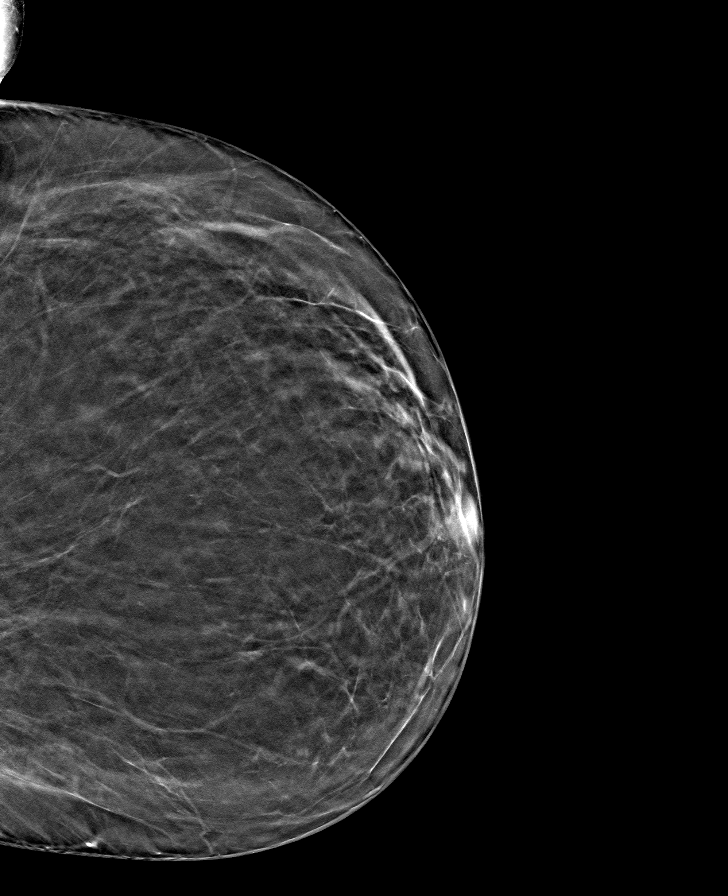

[R MLO tomo · tomo slice 47/93.0]
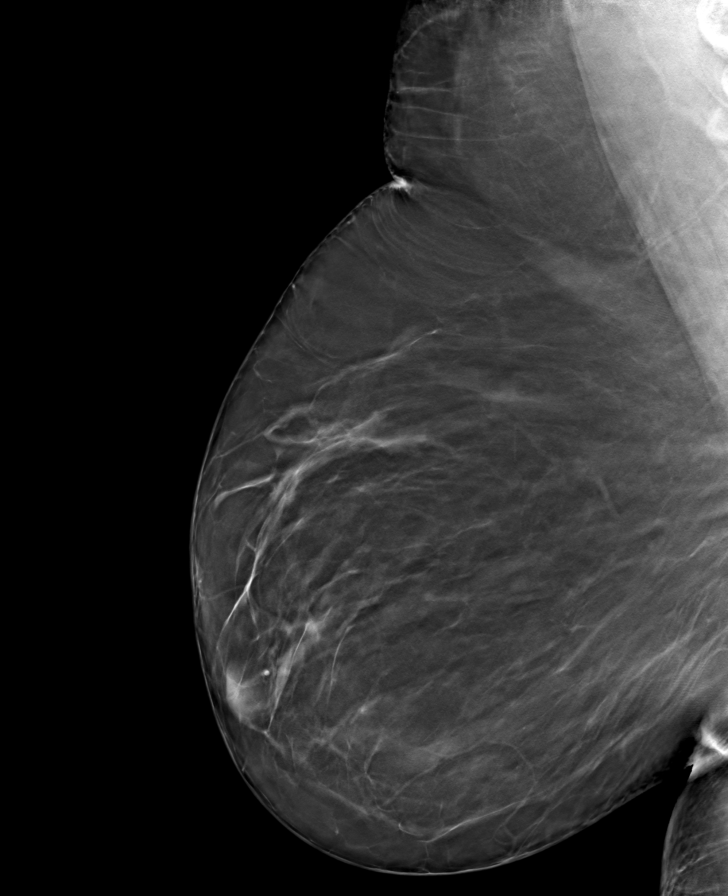

[L MLO tomo · tomo slice 47/93.0]
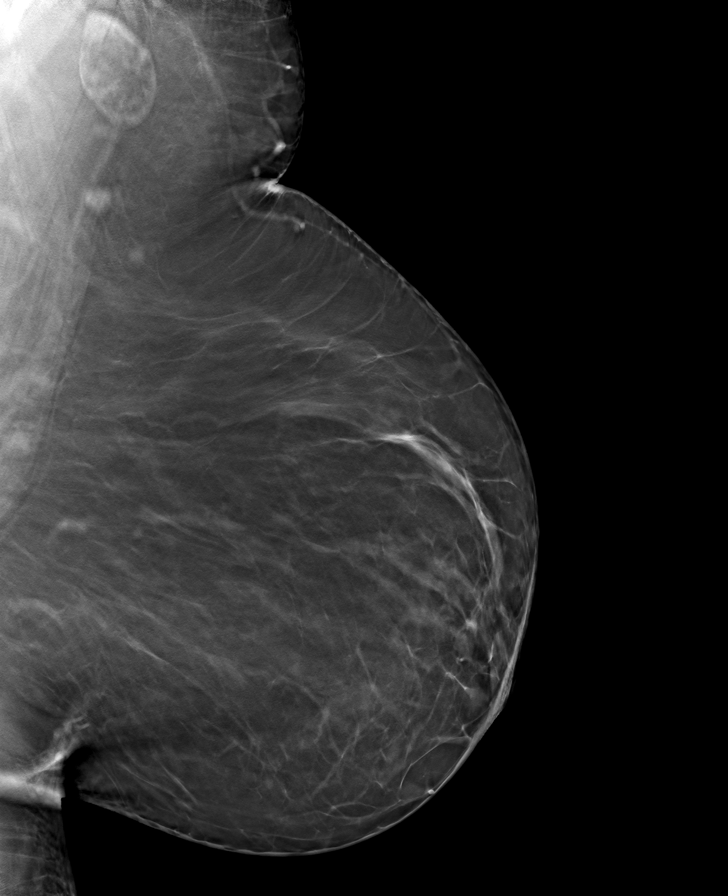

[R CC tomo · tomo slice 35/69.0]
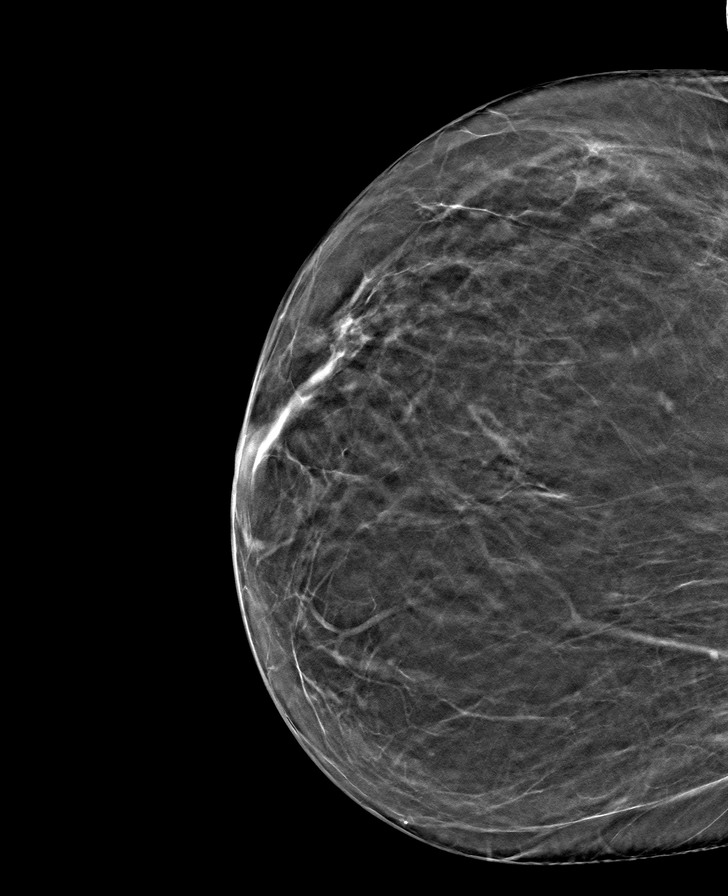

[8 of 24 positions shown; findings below may reference images not displayed]

FINDINGS: There are no findings suspicious for malignancy.
IMPRESSION: No mammographic evidence of malignancy. A result letter of this
screening mammogram will be mailed directly to the patient.

RECOMMENDATION:
Screening mammogram in one year. (Code:0E-3-N98)

BI-RADS CATEGORY  1: Negative.

## 2021-10-08 ENCOUNTER — Emergency Department (HOSPITAL_BASED_OUTPATIENT_CLINIC_OR_DEPARTMENT_OTHER)
Admission: EM | Admit: 2021-10-08 | Discharge: 2021-10-08 | Disposition: A | Discharge status: Home / Self Care | Payer: BC Managed Care – PPO | Attending: Emergency Medicine | Admitting: Emergency Medicine

## 2021-10-08 ENCOUNTER — Encounter (HOSPITAL_BASED_OUTPATIENT_CLINIC_OR_DEPARTMENT_OTHER): Payer: Self-pay | Admitting: Emergency Medicine

## 2021-10-08 ENCOUNTER — Other Ambulatory Visit: Payer: Self-pay

## 2021-10-08 DIAGNOSIS — K8021 Calculus of gallbladder without cholecystitis with obstruction: Secondary | ICD-10-CM | POA: Diagnosis not present

## 2021-10-08 DIAGNOSIS — R109 Unspecified abdominal pain: Secondary | ICD-10-CM | POA: Diagnosis present

## 2021-10-08 LAB — CBC WITH DIFFERENTIAL/PLATELET
Abs Immature Granulocytes: 0.04 10*3/uL (ref 0.00–0.07)
Basophils Absolute: 0.1 10*3/uL (ref 0.0–0.1)
Basophils Relative: 1 %
Eosinophils Absolute: 0.1 10*3/uL (ref 0.0–0.5)
Eosinophils Relative: 1 %
HCT: 43.8 % (ref 36.0–46.0)
Hemoglobin: 14.9 g/dL (ref 12.0–15.0)
Immature Granulocytes: 1 %
Lymphocytes Relative: 39 %
Lymphs Abs: 3.4 10*3/uL (ref 0.7–4.0)
MCH: 29.8 pg (ref 26.0–34.0)
MCHC: 34 g/dL (ref 30.0–36.0)
MCV: 87.6 fL (ref 80.0–100.0)
Monocytes Absolute: 0.4 10*3/uL (ref 0.1–1.0)
Monocytes Relative: 5 %
Neutro Abs: 4.8 10*3/uL (ref 1.7–7.7)
Neutrophils Relative %: 53 %
Platelets: 246 10*3/uL (ref 150–400)
RBC: 5 MIL/uL (ref 3.87–5.11)
RDW: 13.6 % (ref 11.5–15.5)
WBC: 8.9 10*3/uL (ref 4.0–10.5)
nRBC: 0 % (ref 0.0–0.2)

## 2021-10-08 LAB — COMPREHENSIVE METABOLIC PANEL
ALT: 22 U/L (ref 0–44)
AST: 41 U/L (ref 15–41)
Albumin: 4.1 g/dL (ref 3.5–5.0)
Alkaline Phosphatase: 70 U/L (ref 38–126)
Anion gap: 7 (ref 5–15)
BUN: 24 mg/dL — ABNORMAL HIGH (ref 6–20)
CO2: 28 mmol/L (ref 22–32)
Calcium: 9 mg/dL (ref 8.9–10.3)
Chloride: 101 mmol/L (ref 98–111)
Creatinine, Ser: 0.72 mg/dL (ref 0.44–1.00)
GFR, Estimated: >60 mL/min (ref >60)
Glucose, Bld: 102 mg/dL — ABNORMAL HIGH (ref 70–99)
Potassium: 3.2 mmol/L — ABNORMAL LOW (ref 3.5–5.1)
Sodium: 136 mmol/L (ref 135–145)
Total Bilirubin: 0.6 mg/dL (ref 0.3–1.2)
Total Protein: 7.4 g/dL (ref 6.5–8.1)

## 2021-10-08 LAB — LIPASE, BLOOD: Lipase: 50 U/L (ref 11–51)

## 2021-10-08 MED ORDER — ONDANSETRON HCL 4 MG/2ML IJ SOLN
4.0000 mg | Freq: Once | INTRAMUSCULAR | Status: AC
  Administered 2021-10-08: 4 mg via INTRAVENOUS
  Filled 2021-10-08: qty 2

## 2021-10-08 MED ORDER — HYDROCODONE-ACETAMINOPHEN 5-325 MG PO TABS: 1.0000 | ORAL_TABLET | Freq: Four times a day (QID) | ORAL | 0 refills | Status: AC | PRN

## 2021-10-08 MED ORDER — METOCLOPRAMIDE HCL 5 MG/ML IJ SOLN
10.0000 mg | Freq: Once | INTRAMUSCULAR | Status: AC
  Administered 2021-10-08: 10 mg via INTRAVENOUS
  Filled 2021-10-08: qty 2

## 2021-10-08 MED ORDER — FENTANYL CITRATE PF 50 MCG/ML IJ SOSY
50.0000 ug | PREFILLED_SYRINGE | Freq: Once | INTRAMUSCULAR | Status: AC
  Administered 2021-10-08: 50 ug via INTRAVENOUS
  Filled 2021-10-08: qty 1

## 2021-10-08 MED ORDER — ONDANSETRON HCL 4 MG PO TABS: 4.0000 mg | ORAL_TABLET | Freq: Four times a day (QID) | ORAL | 0 refills | Status: AC

## 2021-10-08 MED ORDER — DIPHENHYDRAMINE HCL 50 MG/ML IJ SOLN
25.0000 mg | Freq: Once | INTRAMUSCULAR | Status: AC
  Administered 2021-10-08: 25 mg via INTRAVENOUS
  Filled 2021-10-08: qty 1

## 2021-10-08 NOTE — Discharge Instructions (Addendum)
Keep your established appointment with general surgery regarding elective cholecystectomy.   Overview When you eat, the gallbladder releases bile, which helps you digest the fat in food. If you have an inflamed gallbladder, this may cause pain. A low-fat diet may give your gallbladder a rest so you can start to heal. Your doctor and dietitian can help you make an eating plan that does not irritate your digestive system. Always talk with your doctor or dietitian before you make changes in your diet.  Follow-up care is a key part of your treatment and safety. Be sure to make and go to all appointments, and call your doctor or nurse advice line (811 in most provinces and territories) if you are having problems. It's also a good idea to know your test results and keep a list of the medicines you take.  How can you care for yourself at home? Eat 4 to 6 small meals and snacks each day instead of three large meals. Choose lean meats. Cut off all fat you can see. Eat poultry, like chicken, duck, and [place] without the skin. Many types of fish, such as salmon, lake trout, tuna, and herring, provide healthy omega-3 fat. But, avoid fish canned in oil, such as sardines in olive oil. Bake, broil, or grill meats, poultry, or fish instead of frying them in butter or fat. Choose skim or low-fat milk, yogurt, cheese, other milk products, or fortified soy beverage. Read the labels on cheeses, and choose a reduced fat option. Try fat-free sour cream, cream cheese, or yogurt.  Avoid cream soups and cream sauces on pasta. Eat low-fat ice cream, frozen yogurt, or sorbet. Avoid regular ice cream. Eat a variety of vegetables and fruits. These are high in nutrition and low in fat. Limit the amount of avocado and coconut due to their high fat content. Eat whole grain cereals, breads, crackers, brown rice, or pasta. Avoid breads that have been fried or deep-fried, like bannock or doughnuts, or breads that have a high fat  content, like croissants. Flavour your foods with herbs and spices (such as basil, tarragon, or mint), fat-free sauces, or lemon juice. Try applesauce, prune puree, or mashed bananas to replace some or all of the fat when you bake.  Limit fats and oils, such as butter, margarine, mayonnaise, and salad dressing, to no more than 1 tablespoon (15 mL) a meal.  Avoid high-fat foods, such as: Chocolate, whole milk, ice cream, processed cheese, and egg yolks. Fried, deep fried, or buttered foods. Sausage, salami, and bacon. Cinnamon rolls, cakes, pies, cookies, and other pastries. Prepared snack foods, such as potato chips, nut and granola bars, and mixed nuts. Coconut and avocado. Fast food and convenience food meals that have lots of fat.

## 2021-10-08 NOTE — ED Provider Notes (Addendum)
[facility] EMERGENCY DEPARTMENT Provider Note   CSN: [id number] Arrival date & time: 10/08/21  0258     History  Chief Complaint  Patient presents with   Abdominal Pain    Nicole Cooper is a 56 y.o. female.  Pt is a 56 yo female with pmh of cholelithiasis presenting for abdominal pain. Pt admits to ruq abdominal pain without radiation, severe, associated with nausea, that awoke her from sleep. States she has a hx of gall stones with f/u appointment with general surgery for elective cholecystectomy this week. States she ate tacos last night before bed.   The history is provided by the patient. No language interpreter was used.  Abdominal Pain Associated symptoms: vomiting   Associated symptoms: no chest pain, no chills, no cough, no dysuria, no fever, no hematuria, no shortness of breath and no sore throat       Home Medications Prior to Admission medications   Medication Sig Start Date End Date Taking? Authorizing Provider  HYDROcodone-acetaminophen (NORCO) 5-325 MG tablet Take 1 tablet by mouth every 6 (six) hours as needed for moderate pain. 01/10/77  Yes Campbell Stall P, DO  ondansetron (ZOFRAN) 4 MG tablet Take 1 tablet (4 mg total) by mouth every 6 (six) hours. 2/42/35  Yes Campbell Stall P, DO  albuterol (PROVENTIL HFA;VENTOLIN HFA) 108 (90 Base) MCG/ACT inhaler Inhale 2 puffs into the lungs every 6 (six) hours as needed for wheezing. 10/12/18   Mellody Dance, DO  cholecalciferol (VITAMIN D3) 25 MCG (1000 UNIT) tablet Take 1,000 Units by mouth as directed. Once a week    [provider]  diphenhydrAMINE (BENADRYL) 25 MG tablet Take 25 mg by mouth at bedtime.    [provider]  hydrochlorothiazide (HYDRODIURIL) 25 MG tablet TAKE 1 TABLET (25 MG TOTAL) BY MOUTH DAILY. **PATIENT NEEDS APT FOR FURTHER REFILLS** 07/22/20   Lorrene Reid, PA-C  ibuprofen (ADVIL) 800 MG tablet Take 1 tablet (800 mg total) by mouth every 8 (eight) hours as needed for  moderate pain. For AFTER surgery 03/19/20   Joylene John D, NP  montelukast (SINGULAIR) 10 MG tablet TAKE 1 TABLET (10 MG TOTAL) BY MOUTH AT BEDTIME. **PATIENT NEEDS APT FOR FURTHER REFILLS** 07/22/20   Lorrene Reid, PA-C  omeprazole (PRILOSEC) 20 MG capsule Take 1 capsule (20 mg total) by mouth daily. **PATIENT NEEDS APT FOR FURTHER REFILLS** 04/01/20   Abonza, Maritza, PA-C  senna-docusate (SENOKOT-S) 8.6-50 MG tablet Take 2 tablets by mouth at bedtime. For AFTER surgery, do not take if having diarrhea 03/19/20   Cross, Lenna Sciara D, NP  traMADol (ULTRAM) 50 MG tablet Take 1 tablet (50 mg total) by mouth every 6 (six) hours as needed for severe pain. For AFTER surgery, do not take and drive Patient not taking: Reported on 04/15/2020 03/19/20   Joylene John D, NP  Vitamin D, Ergocalciferol, (DRISDOL) 1.25 MG (50000 UNIT) CAPS capsule TAKE 1 CAPSULE (50,000 UNITS TOTAL) BY MOUTH EVERY 7 (SEVEN) DAYS. OFFICE VISIT REQUIRED PRIOR TO ANY FURTHER REFILLS 07/15/20   Lorrene Reid, PA-C      Allergies    Fentanyl, Keflex [cephalexin], Percocet [oxycodone-acetaminophen], and Sulfa antibiotics    Review of Systems   Review of Systems  Constitutional:  Negative for chills and fever.  HENT:  Negative for ear pain and sore throat.   Eyes:  Negative for pain and visual disturbance.  Respiratory:  Negative for cough and shortness of breath.   Cardiovascular:  Negative for chest pain and palpitations.  Gastrointestinal:  Positive for abdominal pain and vomiting.  Genitourinary:  Negative for dysuria and hematuria.  Musculoskeletal:  Negative for arthralgias and back pain.  Skin:  Negative for color change and rash.  Neurological:  Negative for seizures and syncope.  All other systems reviewed and are negative.  Physical Exam Updated Vital Signs BP 97/60    Pulse 65    Temp 97.8 F (36.6 C) (Oral)    Resp 16    Ht 5\' 4"  (1.626 m)    Wt 98.4 kg    SpO2 99%    BMI 37.25 kg/m  Physical Exam Vitals and  nursing note reviewed.  Constitutional:      General: She is not in acute distress.    Appearance: She is well-developed.  HENT:     Head: Normocephalic and atraumatic.  Eyes:     Conjunctiva/sclera: Conjunctivae normal.  Cardiovascular:     Rate and Rhythm: Normal rate and regular rhythm.     Heart sounds: No murmur heard. Pulmonary:     Effort: Pulmonary effort is normal. No respiratory distress.     Breath sounds: Normal breath sounds.  Abdominal:     Palpations: Abdomen is soft.     Tenderness: There is abdominal tenderness in the right upper quadrant. There is no guarding or rebound.  Musculoskeletal:        General: No swelling.     Cervical back: Neck supple.  Skin:    General: Skin is warm and dry.     Capillary Refill: Capillary refill takes less than 2 seconds.  Neurological:     Mental Status: She is alert.  Psychiatric:        Mood and Affect: Mood normal.    ED Results / Procedures / Treatments   Labs (all labs ordered are listed, but only abnormal results are displayed) Labs Reviewed  COMPREHENSIVE METABOLIC PANEL - Abnormal; Notable for the following components:      Result Value   Potassium 3.2 (*)    Glucose, Bld 102 (*)    BUN 24 (*)    All other components within normal limits  CBC WITH DIFFERENTIAL/PLATELET  LIPASE, BLOOD    EKG None  Radiology No results found.  Procedures Procedures    Medications Ordered in ED Medications  fentaNYL (SUBLIMAZE) injection 50 mcg (50 mcg Intravenous Given 10/08/21 0607)  ondansetron (ZOFRAN) injection 4 mg (4 mg Intravenous Given 10/08/21 7902)  metoCLOPramide (REGLAN) injection 10 mg (10 mg Intravenous Given 10/08/21 0630)  diphenhydrAMINE (BENADRYL) injection 25 mg (25 mg Intravenous Given 10/08/21 0630)    ED Course/ Medical Decision Making/ A&P                           Medical Decision Making Amount and/or Complexity of Data Reviewed Labs: ordered.  Risk Prescription drug  management.   6:04AM 56 yo female with pmh of cholelithiasis presenting for RUQ abdominal pain after eating tacos last night. Pt is Aox3, no acute distress, afebrile, with stable vitals. Abdomen is soft on exam, non-peritoneal, no distension, no guarding, with tenderness to RUQ. Laboratory studies demonstrates no signs of sepsis. Stable liver profile and lipase. Symptoms likely secondary to cholelithiasis. Patient given medication for pain control and recommended to keep appointment with general surgery this week.  On re-evaluation patient admits to complete resolution of symptoms.   Medications for pain control sent to pharmacy.    Patient in no distress and overall condition improved  here in the ED. Detailed discussions were had with the patient regarding current findings, and need for close f/u with PCP or on call doctor. The patient has been instructed to return immediately if the symptoms worsen in any way for re-evaluation. Patient verbalized understanding and is in agreement with current care plan. All questions answered prior to discharge.        Final Clinical Impression(s) / ED Diagnoses Final diagnoses:  Calculus of gallbladder with biliary obstruction but without cholecystitis    Rx / DC Orders ED Discharge Orders          Ordered    HYDROcodone-acetaminophen (NORCO) 5-325 MG tablet  Every 6 hours PRN        10/08/21 0611    ondansetron (ZOFRAN) 4 MG tablet  Every 6 hours        10/08/21 0611              Lianne Cure, DO 48/54/62 7035    Lianne Cure, DO 00/93/81 1142

## 2021-10-08 NOTE — ED Triage Notes (Signed)
Pt c/o abd with nausea. Pt has known gallstone with surgical follow up next week.

## 2021-10-08 NOTE — ED Notes (Signed)
Pt c/o severe nausea after medications. MD aware orders received.

## 2021-10-14 ENCOUNTER — Other Ambulatory Visit: Payer: Self-pay | Admitting: Surgery

## 2021-11-18 ENCOUNTER — Other Ambulatory Visit: Payer: Self-pay | Admitting: Surgery

## 2022-05-06 ENCOUNTER — Other Ambulatory Visit: Payer: Self-pay | Admitting: Obstetrics and Gynecology

## 2022-05-06 DIAGNOSIS — Z1231 Encounter for screening mammogram for malignant neoplasm of breast: Secondary | ICD-10-CM

## 2022-06-04 ENCOUNTER — Ambulatory Visit
Admission: RE | Admit: 2022-06-04 | Discharge: 2022-06-04 | Disposition: A | Discharge status: Home / Self Care | Payer: BC Managed Care – PPO | Source: Ambulatory Visit | Attending: Obstetrics and Gynecology | Admitting: Obstetrics and Gynecology

## 2022-06-04 DIAGNOSIS — Z1231 Encounter for screening mammogram for malignant neoplasm of breast: Secondary | ICD-10-CM

## 2022-08-11 ENCOUNTER — Institutional Professional Consult (permissible substitution): Payer: BC Managed Care – PPO | Admitting: Plastic Surgery

## 2023-05-18 ENCOUNTER — Other Ambulatory Visit: Payer: Self-pay | Admitting: Obstetrics and Gynecology

## 2023-05-18 DIAGNOSIS — Z1231 Encounter for screening mammogram for malignant neoplasm of breast: Secondary | ICD-10-CM

## 2023-06-24 ENCOUNTER — Ambulatory Visit: Payer: BC Managed Care – PPO

## 2023-07-01 ENCOUNTER — Ambulatory Visit
Admission: RE | Admit: 2023-07-01 | Discharge: 2023-07-01 | Disposition: A | Discharge status: Home / Self Care | Payer: 59 | Source: Ambulatory Visit | Attending: Obstetrics and Gynecology | Admitting: Obstetrics and Gynecology

## 2023-07-01 DIAGNOSIS — Z1231 Encounter for screening mammogram for malignant neoplasm of breast: Secondary | ICD-10-CM

## 2024-07-31 ENCOUNTER — Other Ambulatory Visit: Payer: Self-pay | Admitting: Obstetrics and Gynecology

## 2024-07-31 DIAGNOSIS — Z1231 Encounter for screening mammogram for malignant neoplasm of breast: Secondary | ICD-10-CM

## 2024-08-24 ENCOUNTER — Ambulatory Visit
Admission: RE | Admit: 2024-08-24 | Discharge: 2024-08-24 | Disposition: A | Discharge status: Home / Self Care | Source: Ambulatory Visit | Attending: Obstetrics and Gynecology | Admitting: Obstetrics and Gynecology

## 2024-08-24 DIAGNOSIS — Z1231 Encounter for screening mammogram for malignant neoplasm of breast: Secondary | ICD-10-CM

## 2024-09-26 ENCOUNTER — Ambulatory Visit
